# Patient Record
Sex: Female | Born: 1980 | Race: White | Hispanic: No | Marital: Single | Smoking: Current every day smoker
Health system: Southern US, Community
[De-identification: ages and names within clinical notes are randomized; demographics above are authoritative.]

## PROBLEM LIST (undated history)

## (undated) DIAGNOSIS — D219 Benign neoplasm of connective and other soft tissue, unspecified: Secondary | ICD-10-CM

## (undated) HISTORY — PX: OTHER SURGICAL HISTORY: SHX169

## (undated) HISTORY — PX: CHOLECYSTECTOMY: SHX55

## (undated) HISTORY — PX: APPENDECTOMY: SHX54

## (undated) HISTORY — PX: TONSILLECTOMY: SUR1361

---

## 2015-04-11 ENCOUNTER — Emergency Department: Payer: Self-pay

## 2015-04-11 ENCOUNTER — Emergency Department
Admission: EM | Admit: 2015-04-11 | Discharge: 2015-04-11 | Disposition: A | Discharge status: Home / Self Care | Payer: Self-pay | Attending: Emergency Medicine | Admitting: Emergency Medicine

## 2015-04-11 ENCOUNTER — Encounter: Payer: Self-pay | Admitting: Emergency Medicine

## 2015-04-11 DIAGNOSIS — Z720 Tobacco use: Secondary | ICD-10-CM | POA: Insufficient documentation

## 2015-04-11 DIAGNOSIS — R519 Headache, unspecified: Secondary | ICD-10-CM

## 2015-04-11 DIAGNOSIS — R51 Headache: Secondary | ICD-10-CM | POA: Insufficient documentation

## 2015-04-11 MED ORDER — BUTALBITAL-APAP-CAFFEINE 50-325-40 MG PO TABS: 1.0000 | ORAL_TABLET | Freq: Four times a day (QID) | ORAL | Status: AC | PRN

## 2015-04-11 NOTE — ED Notes (Signed)
Pt returned from CT, await results and dispo.

## 2015-04-11 NOTE — Discharge Instructions (Signed)

## 2015-04-11 NOTE — ED Notes (Signed)
Patient verbalized instructions given and need for follow up.

## 2015-04-11 NOTE — ED Notes (Signed)
Pt to ed with c/o headache, states she had a head injury about 8 months ago. Reports she had large laceration and was blind for a few hours.  Pt states she was not seen at that time but states since she has had headaches frequently.

## 2015-04-11 NOTE — ED Provider Notes (Signed)
[facility] Emergency Department Provider Note  ____________________________________________  Time seen: On arrival  I have reviewed the triage vital signs and the nursing notes.   HISTORY  Chief Complaint Headache    HPI Katrina Nguyen is a 34 y.o. female who presents with headache. Patient reports that she had a head injury 8 months ago and never got checked out and since then she has had frequent headaches. She decided to come to the emergency department today to be evaluated for these. She currently does not have a headache. She is anxious that she is in serious damage to her brain. No fevers no chills. No neck pain. No focal deficits. No sick contacts     History reviewed. No pertinent past medical history.  There are no active problems to display for this patient.   Past Surgical History  Procedure Laterality Date  . Tubal    . Appendectomy      Current Outpatient Rx  Name  Route  Sig  Dispense  Refill  . acetaminophen (TYLENOL) 325 MG tablet   Oral   Take 650 mg by mouth every 6 (six) hours as needed for mild pain, moderate pain, fever or headache.         . diphenhydrAMINE (BENADRYL) 25 mg capsule   Oral   Take 25 mg by mouth every 6 (six) hours as needed for itching or allergies.         Marland Kitchen ibuprofen (ADVIL,MOTRIN) 200 MG tablet   Oral   Take 400 mg by mouth every 6 (six) hours as needed for fever, headache, mild pain or moderate pain.           Allergies Review of patient's allergies indicates no known allergies.  History reviewed. No pertinent family history.  Social History Social History  Substance Use Topics  . Smoking status: Current Every Day Smoker  . Smokeless tobacco: None  . Alcohol Use: No    Review of Systems  Constitutional: Negative for fever. Eyes: Negative for visual changes. ENT: Negative for sore throat Cardiovascular: Negative for chest pain. Respiratory: Negative for shortness of  breath. Gastrointestinal: Negative for abdominal pain, vomiting and diarrhea. Genitourinary: Negative for dysuria. Musculoskeletal: Negative for back pain. Skin: Negative for rash. Neurological: Positive for headaches Psychiatric: Mild anxiety    ____________________________________________   PHYSICAL EXAM:  VITAL SIGNS: ED Triage Vitals  Enc Vitals Group     BP 04/11/15 0921 139/99 mmHg     Pulse Rate 04/11/15 0921 84     Resp 04/11/15 0921 20     Temp 04/11/15 0921 98.2 F (36.8 C)     Temp Source 04/11/15 0921 Oral     SpO2 04/11/15 0921 99 %     Weight 04/11/15 0921 152 lb (68.947 kg)     Height 04/11/15 0921 5\' 3"  (1.6 m)     Head Cir --      Peak Flow --      Pain Score 04/11/15 0916 4     Pain Loc --      Pain Edu? --      Excl. in [place]? --      Constitutional: Alert and oriented. Well appearing and in no distress. Eyes: Conjunctivae are normal.  ENT   Head: Normocephalic and atraumatic.   Mouth/Throat: Mucous membranes are moist. Cardiovascular: Normal rate, regular rhythm. Normal and symmetric distal pulses are present in all extremities. No murmurs, rubs, or gallops. Respiratory: Normal respiratory effort without tachypnea nor retractions.  Breath sounds are clear and equal bilaterally.  Gastrointestinal: Soft and non-tender in all quadrants. No distention. There is no CVA tenderness. Genitourinary: deferred Musculoskeletal: Nontender with normal range of motion in all extremities. No lower extremity tenderness nor edema. Neurologic:  Normal speech and language. No gross focal neurologic deficits are appreciated. Skin:  Skin is warm, dry and intact. No rash noted. Psychiatric: Mood and affect are normal. Patient exhibits appropriate insight and judgment.  ____________________________________________    LABS (pertinent positives/negatives)  Labs Reviewed - No data to  display  ____________________________________________   EKG  None  ____________________________________________    RADIOLOGY I have personally reviewed any xrays that were ordered on this patient: CT head is unremarkable  ____________________________________________   PROCEDURES  Procedure(s) performed: none  Critical Care performed: none  ____________________________________________   INITIAL IMPRESSION / ASSESSMENT AND PLAN / ED COURSE  Pertinent labs & imaging results that were available during my care of the patient were reviewed by me and considered in my medical decision making (see chart for details).  CT head unremarkable. Benign exam. Well appearing patient. We will treat with NSAIDs and Fioricet as needed for headaches and have her follow-up with neurology  ____________________________________________   FINAL CLINICAL IMPRESSION(S) / ED DIAGNOSES  Final diagnoses:  Acute nonintractable headache, unspecified headache type     Lavonia Drafts, MD 04/11/15 1309

## 2015-04-11 NOTE — ED Notes (Signed)
Patient transported to CT 

## 2015-10-04 ENCOUNTER — Emergency Department
Admission: EM | Admit: 2015-10-04 | Discharge: 2015-10-04 | Disposition: A | Discharge status: Home / Self Care | Payer: Self-pay | Attending: Emergency Medicine | Admitting: Emergency Medicine

## 2015-10-04 ENCOUNTER — Emergency Department: Payer: Self-pay

## 2015-10-04 ENCOUNTER — Encounter: Payer: Self-pay | Admitting: Emergency Medicine

## 2015-10-04 DIAGNOSIS — J9801 Acute bronchospasm: Secondary | ICD-10-CM | POA: Insufficient documentation

## 2015-10-04 DIAGNOSIS — F17200 Nicotine dependence, unspecified, uncomplicated: Secondary | ICD-10-CM | POA: Insufficient documentation

## 2015-10-04 DIAGNOSIS — F419 Anxiety disorder, unspecified: Secondary | ICD-10-CM | POA: Insufficient documentation

## 2015-10-04 LAB — COMPREHENSIVE METABOLIC PANEL
ALBUMIN: 3.9 g/dL (ref 3.5–5.0)
ALT: 13 U/L — ABNORMAL LOW (ref 14–54)
AST: 14 U/L — AB (ref 15–41)
Alkaline Phosphatase: 64 U/L (ref 38–126)
Anion gap: 8 (ref 5–15)
BUN: 18 mg/dL (ref 6–20)
CHLORIDE: 109 mmol/L (ref 101–111)
CO2: 21 mmol/L — ABNORMAL LOW (ref 22–32)
Calcium: 8.8 mg/dL — ABNORMAL LOW (ref 8.9–10.3)
Creatinine, Ser: 1.25 mg/dL — ABNORMAL HIGH (ref 0.44–1.00)
GFR calc Af Amer: >60 mL/min (ref >60)
GFR calc non Af Amer: 55 mL/min — ABNORMAL LOW (ref >60)
Glucose, Bld: 85 mg/dL (ref 65–99)
POTASSIUM: 4.4 mmol/L (ref 3.5–5.1)
Sodium: 138 mmol/L (ref 135–145)
Total Bilirubin: 0.3 mg/dL (ref 0.3–1.2)
Total Protein: 7.5 g/dL (ref 6.5–8.1)

## 2015-10-04 LAB — CBC
HCT: 42.5 % (ref 35.0–47.0)
Hemoglobin: 14.2 g/dL (ref 12.0–16.0)
MCH: 27.7 pg (ref 26.0–34.0)
MCHC: 33.3 g/dL (ref 32.0–36.0)
MCV: 83.2 fL (ref 80.0–100.0)
Platelets: 320 10*3/uL (ref 150–440)
RBC: 5.11 MIL/uL (ref 3.80–5.20)
RDW: 15.2 % — ABNORMAL HIGH (ref 11.5–14.5)
WBC: 12.1 10*3/uL — ABNORMAL HIGH (ref 3.6–11.0)

## 2015-10-04 LAB — TROPONIN I

## 2015-10-04 MED ORDER — IPRATROPIUM-ALBUTEROL 0.5-2.5 (3) MG/3ML IN SOLN
RESPIRATORY_TRACT | Status: AC
  Administered 2015-10-04: 3 mL via RESPIRATORY_TRACT
  Filled 2015-10-04: qty 3

## 2015-10-04 MED ORDER — PREDNISONE 50 MG PO TABS: 50.0000 mg | ORAL_TABLET | Freq: Every day | ORAL | Status: AC

## 2015-10-04 MED ORDER — IPRATROPIUM-ALBUTEROL 0.5-2.5 (3) MG/3ML IN SOLN
3.0000 mL | Freq: Once | RESPIRATORY_TRACT | Status: AC
  Administered 2015-10-04: 3 mL via RESPIRATORY_TRACT

## 2015-10-04 MED ORDER — ALBUTEROL SULFATE HFA 108 (90 BASE) MCG/ACT IN AERS: 2.0000 | INHALATION_SPRAY | Freq: Four times a day (QID) | RESPIRATORY_TRACT | Status: AC | PRN

## 2015-10-04 MED ORDER — IPRATROPIUM-ALBUTEROL 0.5-2.5 (3) MG/3ML IN SOLN
3.0000 mL | Freq: Once | RESPIRATORY_TRACT | Status: AC
  Administered 2015-10-04: 3 mL via RESPIRATORY_TRACT
  Filled 2015-10-04: qty 3

## 2015-10-04 MED ORDER — ALBUTEROL SULFATE (2.5 MG/3ML) 0.083% IN NEBU
2.5000 mg | INHALATION_SOLUTION | Freq: Once | RESPIRATORY_TRACT | Status: AC
  Administered 2015-10-04: 2.5 mg via RESPIRATORY_TRACT
  Filled 2015-10-04: qty 3

## 2015-10-04 MED ORDER — PREDNISONE 20 MG PO TABS
60.0000 mg | ORAL_TABLET | Freq: Once | ORAL | Status: AC
  Administered 2015-10-04: 60 mg via ORAL
  Filled 2015-10-04: qty 3

## 2015-10-04 NOTE — Discharge Instructions (Signed)
Bronchospasm, Adult  A bronchospasm is a spasm or tightening of the airways going into the lungs. During a bronchospasm breathing becomes more difficult because the airways get smaller. When this happens there can be coughing, a whistling sound when breathing (wheezing), and difficulty breathing. Bronchospasm is often associated with asthma, but not all patients who experience a bronchospasm have asthma.  CAUSES   A bronchospasm is caused by inflammation or irritation of the airways. The inflammation or irritation may be triggered by:   · Allergies (such as to animals, pollen, food, or mold). Allergens that cause bronchospasm may cause wheezing immediately after exposure or many hours later.    · Infection. Viral infections are believed to be the most common cause of bronchospasm.    · Exercise.    · Irritants (such as pollution, cigarette smoke, strong odors, aerosol sprays, and paint fumes).    · Weather changes. Winds increase molds and pollens in the air. Rain refreshes the air by washing irritants out. Cold air may cause inflammation.    · Stress and emotional upset.    SIGNS AND SYMPTOMS   · Wheezing.    · Excessive nighttime coughing.    · Frequent or severe coughing with a simple cold.    · Chest tightness.    · Shortness of breath.    DIAGNOSIS   Bronchospasm is usually diagnosed through a history and physical exam. Tests, such as chest X-rays, are sometimes done to look for other conditions.  TREATMENT   · Inhaled medicines can be given to open up your airways and help you breathe. The medicines can be given using either an inhaler or a nebulizer machine.  · Corticosteroid medicines may be given for severe bronchospasm, usually when it is associated with asthma.  HOME CARE INSTRUCTIONS   · Always have a plan prepared for seeking medical care. Know when to call your health care provider and local emergency services (911 in the [place] Know where you can access local emergency care.  · Only take medicines as  directed by your health care provider.  · If you were prescribed an inhaler or nebulizer machine, ask your health care provider to explain how to use it correctly. Always use a spacer with your inhaler if you were given one.  · It is necessary to remain calm during an attack. Try to relax and breathe more slowly.   · Control your home environment in the following ways:      Change your heating and air conditioning filter at least once a month.      Limit your use of fireplaces and wood stoves.    Do not smoke and do not allow smoking in your home.      Avoid exposure to perfumes and fragrances.      Get rid of pests (such as roaches and mice) and their droppings.      Throw away plants if you see mold on them.      Keep your house clean and dust free.      Replace carpet with wood, tile, or vinyl flooring. Carpet can trap dander and dust.      Use allergy-proof pillows, mattress covers, and box spring covers.      Wash bed sheets and blankets every week in hot water and dry them in a dryer.      Use blankets that are made of polyester or cotton.      Wash hands frequently.  SEEK MEDICAL CARE IF:   · You have muscle aches.    · You have chest pain.    · The sputum changes from clear or   white to yellow, green, gray, or bloody.    · The sputum you cough up gets thicker.    · There are problems that may be related to the medicine you are given, such as a rash, itching, swelling, or trouble breathing.    SEEK IMMEDIATE MEDICAL CARE IF:   · You have worsening wheezing and coughing even after taking your prescribed medicines.    · You have increased difficulty breathing.    · You develop severe chest pain.  MAKE SURE YOU:   · Understand these instructions.  · Will watch your condition.  · Will get help right away if you are not doing well or get worse.     This information is not intended to replace advice given to you by your health care provider. Make sure you discuss any questions you have with your health care  provider.     Document Released: 08/19/2003 Document Revised: 09/06/2014 Document Reviewed: 02/05/2013  Elsevier Interactive Patient Education ©2016 [employer]

## 2015-10-04 NOTE — ED Notes (Signed)
Patient c/o shortness of breath. Patient states that yesterday she had a spell where she was extremely short of breath, it hurts more to breath out than in, patient gets short of breath when walking. Patient is also c/o some chest pain in her central chest that radiates through to the back.

## 2015-10-04 NOTE — ED Provider Notes (Signed)
[facility] Emergency Department Provider Note  ____________________________________________    I have reviewed the triage vital signs and the nursing notes.   HISTORY  Chief Complaint Shortness of Breath    HPI Katrina Nguyen is a 35 y.o. female who presents with complaints of shortness of breath. She reports over the last several months she has noted increasing shortness of breath especially with exertion. She denies chest pain. She does report that her lungs feel "tight. She does smoke and has done so for quite some time. Her mother died of emphysema. She denies fevers chills. Occasional cough. No recent travel. No leg swelling. No calf pain.     History reviewed. No pertinent past medical history.  There are no active problems to display for this patient.   Past Surgical History  Procedure Laterality Date  . Tubal    . Appendectomy    . Cholecystectomy    . Tonsillectomy      Current Outpatient Rx  Name  Route  Sig  Dispense  Refill  . albuterol (PROVENTIL HFA;VENTOLIN HFA) 108 (90 Base) MCG/ACT inhaler   Inhalation   Inhale 2 puffs into the lungs every 6 (six) hours as needed for wheezing or shortness of breath.   1 Inhaler   2   . butalbital-acetaminophen-caffeine (FIORICET) 50-325-40 MG per tablet   Oral   Take 1-2 tablets by mouth every 6 (six) hours as needed for headache.   20 tablet   0   . predniSONE (DELTASONE) 50 MG tablet   Oral   Take 1 tablet (50 mg total) by mouth daily with breakfast.   5 tablet   0     Allergies Review of patient's allergies indicates no known allergies.  No family history on file.  Social History Social History  Substance Use Topics  . Smoking status: Current Every Day Smoker  . Smokeless tobacco: None  . Alcohol Use: No    Review of Systems  Constitutional: Negative for fever. Eyes: Negative for visual changes. ENT: Negative for sore throat Cardiovascular: Negative for chest  pain. Respiratory: As above, negative for cough Gastrointestinal: Negative for abdominal pain Genitourinary: Negative for dysuria. Musculoskeletal: Negative for back pain. Skin: Negative for rash. Neurological: Negative for headaches  Psychiatric: Positive for anxiety    ____________________________________________   PHYSICAL EXAM:  VITAL SIGNS: ED Triage Vitals  Enc Vitals Group     BP 10/04/15 0841 124/85 mmHg     Pulse Rate 10/04/15 0841 86     Resp 10/04/15 0841 22     Temp 10/04/15 0841 97.5 F (36.4 C)     Temp Source 10/04/15 0841 Oral     SpO2 10/04/15 0841 95 %     Weight 10/04/15 0841 150 lb (68.04 kg)     Height 10/04/15 0841 5\' 1"  (1.549 m)     Head Cir --      Peak Flow --      Pain Score 10/04/15 0842 5     Pain Loc --      Pain Edu? --      Excl. in [place]? --      Constitutional: Alert and oriented. Well appearing and in no distress. Eyes: Conjunctivae are normal.  ENT   Head: Normocephalic and atraumatic.   Mouth/Throat: Mucous membranes are moist. Cardiovascular: Normal rate, regular rhythm. Normal and symmetric distal pulses are present in all extremities. No murmurs, rubs, or gallops. Respiratory: Normal respiratory effort without tachypnea nor retractions. Poor air  movement on exam Gastrointestinal: Soft and non-tender in all quadrants. No distention. There is no CVA tenderness. Genitourinary: deferred Musculoskeletal: Nontender with normal range of motion in all extremities. No lower extremity tenderness nor edema. Neurologic:  Normal speech and language. No gross focal neurologic deficits are appreciated. Skin:  Skin is warm, dry and intact. No rash noted. Psychiatric: Mood and affect are normal. Patient exhibits appropriate insight and judgment.  ____________________________________________    LABS (pertinent positives/negatives)  Labs Reviewed  CBC - Abnormal; Notable for the following:    WBC 12.1 (*)    RDW 15.2 (*)    All other  components within normal limits  COMPREHENSIVE METABOLIC PANEL - Abnormal; Notable for the following:    CO2 21 (*)    Creatinine, Ser 1.25 (*)    Calcium 8.8 (*)    AST 14 (*)    ALT 13 (*)    GFR calc non Af Amer 55 (*)    All other components within normal limits  TROPONIN I    ____________________________________________   EKG  ED ECG REPORT I, Lavonia Drafts, the attending physician, personally viewed and interpreted this ECG.  Date: 10/04/2015 EKG Time: 9:26 AM Rate: 72 Rhythm: normal sinus rhythm QRS Axis: normal Intervals: normal ST/T Wave abnormalities: normal Conduction Disturbances: none Narrative Interpretation: unremarkable   ____________________________________________    RADIOLOGY I have personally reviewed any xrays that were ordered on this patient: Chest x-ray normal  ____________________________________________   PROCEDURES  Procedure(s) performed: none  Critical Care performed: none  ____________________________________________   INITIAL IMPRESSION / ASSESSMENT AND PLAN / ED COURSE  Pertinent labs & imaging results that were available during my care of the patient were reviewed by me and considered in my medical decision making (see chart for details).  Patient's presentation is most consistent with bronchospasm/COPD. We will treat with DuoNeb, steroids, check labs, EKG and chest x-ray and reevaluate.  PERC negative, ekg/trop negative  Improving with nebulizers and prednisone.   Workup is unremarkable. History of present illness an exam most consistent with bronchospasm likely early COPD given the patient's severe smoking history. I did spend a significant amount of time counseling her to quit smoking. I'll have her follow-up with medicine for further workup  ____________________________________________   FINAL CLINICAL IMPRESSION(S) / ED DIAGNOSES  Final diagnoses:  Bronchospasm, acute     Lavonia Drafts, MD 10/04/15 1126

## 2015-10-04 NOTE — ED Notes (Signed)
Patient reports feeling short of breath for several months.  Patient states she was exposed to mold during this summer and has had some breathing difficulties since then.  Patient reports shortness of breath has gotten worse over the past two weeks.  Patient states yesterday she couldn't catch her breath for several hours and patient ended up having a panic attack.  Patient reports family history of lung disease.  Patient reports feeling short of breath when she takes short walks.  Patient is speaking in full sentences without any difficulty at this time.

## 2016-01-10 ENCOUNTER — Emergency Department
Admission: EM | Admit: 2016-01-10 | Discharge: 2016-01-10 | Disposition: A | Discharge status: Home / Self Care | Payer: Self-pay | Attending: Emergency Medicine | Admitting: Emergency Medicine

## 2016-01-10 ENCOUNTER — Encounter: Payer: Self-pay | Admitting: Emergency Medicine

## 2016-01-10 DIAGNOSIS — F17210 Nicotine dependence, cigarettes, uncomplicated: Secondary | ICD-10-CM | POA: Insufficient documentation

## 2016-01-10 DIAGNOSIS — H66001 Acute suppurative otitis media without spontaneous rupture of ear drum, right ear: Secondary | ICD-10-CM | POA: Insufficient documentation

## 2016-01-10 DIAGNOSIS — J0140 Acute pansinusitis, unspecified: Secondary | ICD-10-CM | POA: Insufficient documentation

## 2016-01-10 DIAGNOSIS — Z79899 Other long term (current) drug therapy: Secondary | ICD-10-CM | POA: Insufficient documentation

## 2016-01-10 MED ORDER — FLUTICASONE FUROATE 27.5 MCG/SPRAY NA SUSP: 1.0000 | Freq: Every day | NASAL | Status: AC

## 2016-01-10 MED ORDER — CEFTRIAXONE SODIUM 1 G IJ SOLR
1.0000 g | Freq: Once | INTRAMUSCULAR | Status: AC
  Administered 2016-01-10: 1 g via INTRAMUSCULAR
  Filled 2016-01-10: qty 10

## 2016-01-10 MED ORDER — HYDROCOD POLST-CPM POLST ER 10-8 MG/5ML PO SUER
5.0000 mL | Freq: Once | ORAL | Status: AC
Start: 2016-01-10 — End: 2016-01-10
  Administered 2016-01-10: 5 mL via ORAL
  Filled 2016-01-10: qty 5

## 2016-01-10 MED ORDER — GUAIFENESIN-CODEINE 100-10 MG/5ML PO SYRP
5.0000 mL | ORAL_SOLUTION | Freq: Three times a day (TID) | ORAL | Status: AC | PRN
Start: 2016-01-10 — End: ?

## 2016-01-10 MED ORDER — AMOXICILLIN 500 MG PO TABS: 500.0000 mg | ORAL_TABLET | Freq: Three times a day (TID) | ORAL | Status: AC

## 2016-01-10 MED ORDER — LIDOCAINE HCL (PF) 1 % IJ SOLN
2.1000 mL | Freq: Once | INTRAMUSCULAR | Status: AC
  Administered 2016-01-10: 2.1 mL
  Filled 2016-01-10: qty 5

## 2016-01-10 NOTE — Discharge Instructions (Signed)

## 2016-01-10 NOTE — ED Notes (Signed)
Pt c/o sinus congestion for a week, green/white/yellow sinus drainage; blew her nose earlier and her right ear popped; now having ear pain; pt ambulatory with steady gait

## 2016-01-10 NOTE — ED Provider Notes (Signed)
[facility] Emergency Department Provider Note  ____________________________________________  Time seen: Approximately 9:15 PM  I have reviewed the triage vital signs and the nursing notes.   HISTORY  Chief Complaint Otalgia and Nasal Congestion   HPI Icelynn Toki is a 35 y.o. female who presents to the emergency department for evaluation of right ear pain, sore throat, and sinus congestion. She's had no relief with over-the-counter medications. Symptoms have been present for the past week or more.   History reviewed. No pertinent past medical history.  There are no active problems to display for this patient.   Past Surgical History  Procedure Laterality Date  . Tubal    . Appendectomy    . Cholecystectomy    . Tonsillectomy      Current Outpatient Rx  Name  Route  Sig  Dispense  Refill  . albuterol (PROVENTIL HFA;VENTOLIN HFA) 108 (90 Base) MCG/ACT inhaler   Inhalation   Inhale 2 puffs into the lungs every 6 (six) hours as needed for wheezing or shortness of breath.   1 Inhaler   2   . amoxicillin (AMOXIL) 500 MG tablet   Oral   Take 1 tablet (500 mg total) by mouth 3 (three) times daily.   30 tablet   0   . butalbital-acetaminophen-caffeine (FIORICET) 50-325-40 MG per tablet   Oral   Take 1-2 tablets by mouth every 6 (six) hours as needed for headache.   20 tablet   0   . fluticasone (VERAMYST) 27.5 MCG/SPRAY nasal spray   Nasal   Place 1 spray into the nose daily.   10 g   0   . guaiFENesin-codeine (ROBITUSSIN AC) 100-10 MG/5ML syrup   Oral   Take 5 mLs by mouth 3 (three) times daily as needed for cough.   120 mL   0   . predniSONE (DELTASONE) 50 MG tablet   Oral   Take 1 tablet (50 mg total) by mouth daily with breakfast.   5 tablet   0     Allergies Review of patient's allergies indicates no known allergies.  History reviewed. No pertinent family history.  Social History Social History  Substance Use Topics   . Smoking status: Current Every Day Smoker    Types: Cigarettes  . Smokeless tobacco: None  . Alcohol Use: No    Review of Systems Constitutional: Positive fever/chills ENT: Positive for sore throat. Cardiovascular: Denies chest pain. Respiratory: Negative for shortness of breath. Positive for cough. Gastrointestinal: Negative for nausea,  negative for vomiting.  Negative for diarrhea.  Musculoskeletal: Negative for body aches Skin: Negative for rash. Neurological: Positive for headaches ____________________________________________   PHYSICAL EXAM:  VITAL SIGNS: ED Triage Vitals  Enc Vitals Group     BP 01/10/16 2000 125/79 mmHg     Pulse Rate 01/10/16 2000 90     Resp 01/10/16 2000 18     Temp 01/10/16 2000 98.4 F (36.9 C)     Temp Source 01/10/16 2000 Oral     SpO2 01/10/16 2000 99 %     Weight 01/10/16 2000 155 lb (70.308 kg)     Height 01/10/16 2000 5\' 3"  (1.6 m)     Head Cir --      Peak Flow --      Pain Score 01/10/16 2002 5     Pain Loc --      Pain Edu? --      Excl. in [place]? --     Constitutional:  Alert and oriented. Acutely ill appearing and in no acute distress. Eyes: Conjunctivae are normal. EOMI. Ears: Right tympanic membrane erythematous, dull, loss of light reflex, intact; left tympanic membrane normal Nose: Sinus congestion and tenderness throughout but mainly on the right maxillary; no rhinnorhea. Mouth/Throat: Mucous membranes are moist.  Oropharynx mildly erythematous. Tonsils appear mildly enlarged without exudate. Neck: No stridor.  Lymphatic: No cervical lymphadenopathy. Cardiovascular: Normal rate, regular rhythm. Grossly normal heart sounds.  Good peripheral circulation. Respiratory: Normal respiratory effort.  No retractions. Diminished but clear throughout. Gastrointestinal: Soft and nontender.  Musculoskeletal: FROM x 4 extremities.  Neurologic:  Normal speech and language.  Skin:  Skin is warm, dry and intact. No rash  noted. Psychiatric: Mood and affect are normal. Speech and behavior are normal.  ____________________________________________   LABS (all labs ordered are listed, but only abnormal results are displayed)  Labs Reviewed - No data to display ____________________________________________  EKG   ____________________________________________  RADIOLOGY   ____________________________________________   PROCEDURES  Procedure(s) performed: None  Critical Care performed: No  ____________________________________________   INITIAL IMPRESSION / ASSESSMENT AND PLAN / ED COURSE  Pertinent labs & imaging results that were available during my care of the patient were reviewed by me and considered in my medical decision making (see chart for details).   Patient was encouraged to follow up with the primary care provider of her choice for symptoms that are not improving over the next 48 hours. She'll be placed on amoxicillin, fluticasone nasal spray, and Robitussin-AC. She was given Rocephin 1 g IM while in the emergency department. She was advised to return to the emergency department for symptoms that change or worsen if some unable schedule an appointment. ____________________________________________   FINAL CLINICAL IMPRESSION(S) / ED DIAGNOSES  Final diagnoses:  Acute suppurative otitis media of right ear without spontaneous rupture of tympanic membrane, recurrence not specified  Acute pansinusitis, recurrence not specified       Victorino Dike, FNP 01/10/16 2225  Lavonia Drafts, MD 01/10/16 2243

## 2016-08-01 ENCOUNTER — Encounter: Payer: Self-pay | Admitting: Emergency Medicine

## 2016-08-01 ENCOUNTER — Emergency Department: Payer: Self-pay

## 2016-08-01 ENCOUNTER — Emergency Department
Admission: EM | Admit: 2016-08-01 | Discharge: 2016-08-01 | Disposition: A | Discharge status: Home / Self Care | Payer: Self-pay | Attending: Emergency Medicine | Admitting: Emergency Medicine

## 2016-08-01 DIAGNOSIS — N941 Unspecified dyspareunia: Secondary | ICD-10-CM

## 2016-08-01 DIAGNOSIS — R102 Pelvic and perineal pain unspecified side: Secondary | ICD-10-CM

## 2016-08-01 DIAGNOSIS — R1031 Right lower quadrant pain: Secondary | ICD-10-CM

## 2016-08-01 DIAGNOSIS — Z79899 Other long term (current) drug therapy: Secondary | ICD-10-CM | POA: Insufficient documentation

## 2016-08-01 DIAGNOSIS — A599 Trichomoniasis, unspecified: Secondary | ICD-10-CM

## 2016-08-01 DIAGNOSIS — N83511 Torsion of right ovary and ovarian pedicle: Secondary | ICD-10-CM | POA: Insufficient documentation

## 2016-08-01 DIAGNOSIS — F17210 Nicotine dependence, cigarettes, uncomplicated: Secondary | ICD-10-CM | POA: Insufficient documentation

## 2016-08-01 DIAGNOSIS — N83519 Torsion of ovary and ovarian pedicle, unspecified side: Secondary | ICD-10-CM

## 2016-08-01 LAB — WET PREP, GENITAL
CLUE CELLS WET PREP: NONE SEEN
SPERM: NONE SEEN
YEAST WET PREP: NONE SEEN

## 2016-08-01 LAB — COMPREHENSIVE METABOLIC PANEL
ALK PHOS: 88 U/L (ref 38–126)
ALT: 12 U/L — AB (ref 14–54)
AST: 22 U/L (ref 15–41)
Albumin: 4.2 g/dL (ref 3.5–5.0)
Anion gap: 8 (ref 5–15)
BILIRUBIN TOTAL: 0.9 mg/dL (ref 0.3–1.2)
BUN: 16 mg/dL (ref 6–20)
CO2: 20 mmol/L — ABNORMAL LOW (ref 22–32)
CREATININE: 0.93 mg/dL (ref 0.44–1.00)
Calcium: 9.5 mg/dL (ref 8.9–10.3)
Chloride: 104 mmol/L (ref 101–111)
Glucose, Bld: 131 mg/dL — ABNORMAL HIGH (ref 65–99)
Potassium: 4.4 mmol/L (ref 3.5–5.1)
SODIUM: 132 mmol/L — AB (ref 135–145)
Total Protein: 8 g/dL (ref 6.5–8.1)

## 2016-08-01 LAB — CBC
HCT: 40.6 % (ref 35.0–47.0)
Hemoglobin: 13.8 g/dL (ref 12.0–16.0)
MCH: 27.4 pg (ref 26.0–34.0)
MCHC: 33.9 g/dL (ref 32.0–36.0)
MCV: 80.8 fL (ref 80.0–100.0)
PLATELETS: 372 10*3/uL (ref 150–440)
RBC: 5.03 MIL/uL (ref 3.80–5.20)
RDW: 18.3 % — ABNORMAL HIGH (ref 11.5–14.5)
WBC: 14.4 10*3/uL — ABNORMAL HIGH (ref 3.6–11.0)

## 2016-08-01 LAB — URINALYSIS COMPLETE WITH MICROSCOPIC (ARMC ONLY)
Bilirubin Urine: NEGATIVE
Glucose, UA: NEGATIVE mg/dL
Nitrite: NEGATIVE
Protein, ur: 30 mg/dL — AB
Specific Gravity, Urine: 1.025 (ref 1.005–1.030)
pH: 5 (ref 5.0–8.0)

## 2016-08-01 LAB — CHLAMYDIA/NGC RT PCR (ARMC ONLY)
Chlamydia Tr: NOT DETECTED
N gonorrhoeae: NOT DETECTED

## 2016-08-01 LAB — PREGNANCY, URINE: Preg Test, Ur: NEGATIVE

## 2016-08-01 MED ORDER — ONDANSETRON 4 MG PO TBDP: ORAL_TABLET | ORAL | 0 refills | Status: AC

## 2016-08-01 MED ORDER — IOPAMIDOL (ISOVUE-300) INJECTION 61%
100.0000 mL | Freq: Once | INTRAVENOUS | Status: AC | PRN
  Administered 2016-08-01: 100 mL via INTRAVENOUS
  Filled 2016-08-01: qty 100

## 2016-08-01 MED ORDER — AZITHROMYCIN 250 MG PO TABS
1000.0000 mg | ORAL_TABLET | ORAL | Status: AC
  Administered 2016-08-01: 1000 mg via ORAL

## 2016-08-01 MED ORDER — AZITHROMYCIN 500 MG PO TABS
ORAL_TABLET | ORAL | Status: AC
  Administered 2016-08-01: 1000 mg via ORAL
  Filled 2016-08-01: qty 2

## 2016-08-01 MED ORDER — METRONIDAZOLE 500 MG PO TABS
500.0000 mg | ORAL_TABLET | Freq: Once | ORAL | Status: AC
  Administered 2016-08-01: 500 mg via ORAL
  Filled 2016-08-01: qty 1

## 2016-08-01 MED ORDER — METRONIDAZOLE 500 MG PO TABS: 500.0000 mg | ORAL_TABLET | Freq: Two times a day (BID) | ORAL | 0 refills | Status: AC

## 2016-08-01 MED ORDER — CEFTRIAXONE SODIUM 250 MG IJ SOLR
INTRAMUSCULAR | Status: AC
  Administered 2016-08-01: 250 mg via INTRAMUSCULAR
  Filled 2016-08-01: qty 250

## 2016-08-01 MED ORDER — IOPAMIDOL (ISOVUE-300) INJECTION 61%
30.0000 mL | Freq: Once | INTRAVENOUS | Status: AC | PRN
  Administered 2016-08-01: 30 mL via ORAL
  Filled 2016-08-01: qty 30

## 2016-08-01 MED ORDER — OXYCODONE-ACETAMINOPHEN 5-325 MG PO TABS
ORAL_TABLET | ORAL | Status: AC
  Administered 2016-08-01: 2 via ORAL
  Filled 2016-08-01: qty 2

## 2016-08-01 MED ORDER — CEFTRIAXONE SODIUM 250 MG IJ SOLR
250.0000 mg | Freq: Once | INTRAMUSCULAR | Status: AC
  Administered 2016-08-01: 250 mg via INTRAMUSCULAR

## 2016-08-01 MED ORDER — OXYCODONE-ACETAMINOPHEN 5-325 MG PO TABS
2.0000 | ORAL_TABLET | Freq: Once | ORAL | Status: AC
  Administered 2016-08-01: 2 via ORAL

## 2016-08-01 NOTE — ED Notes (Signed)
Called lab and asked to run urine sample for pregnancy so ultrasound can be performed. Russell in lab states that he will perform test now.

## 2016-08-01 NOTE — ED Notes (Signed)
CT informed that patient is finished with oral contrast

## 2016-08-01 NOTE — ED Notes (Signed)
Right lower quadrant pain X 3 weeks, described as aching. Denies NVD. Normal BM.  Pt has not been seen for this pain before. PT ambulatory back to room. Pt alert and oriented X4, active, cooperative, pt in NAD. RR even and unlabored, color WNL.

## 2016-08-01 NOTE — ED Provider Notes (Signed)
[facility] Emergency Department Provider Note  ____________________________________________   First MD Initiated Contact with Patient 08/01/16 1436     (approximate)  I have reviewed the triage vital signs and the nursing notes.   HISTORY  Chief Complaint Pelvic Pain; Abdominal Pain; and Vaginal Itching    HPI Katrina Nguyen is a 35 y.o. female who denies any chronic medical problems but who has had a bilateral tubal ligation, appendectomy, and cholecystectomy and presents for gradual onset about worsening right lower pelvic pain 3 weeks.  She states that it started mild and she was hoping it would go away but it has become severe, particularly over the last week.  She also notes severe dyspareunia and a much heavier than normal period that is currently active.  She has had occasional nausea but no vomiting.  She describes the pain as a combination of a severe aching and occasionally a sharp stabbing pain.  She states that movement  And ambulation as well as sexual intercourse I will make it worse.  She is also noticed some vaginal itching recently that she does not know if it is related.  She denies diarrhea, constipation, chest pain, shortness of breath, fever/chills, headache.  She does not have any outpatient providers, either primary care nor GYN, and has not had a Pap smear in several years.  She has no history of kidney stones.  History reviewed. No pertinent past medical history.  There are no active problems to display for this patient.   Past Surgical History:  Procedure Laterality Date  . APPENDECTOMY    . CHOLECYSTECTOMY    . TONSILLECTOMY    . tubal      Prior to Admission medications   Medication Sig Start Date End Date Taking? Authorizing Provider  albuterol (PROVENTIL HFA;VENTOLIN HFA) 108 (90 Base) MCG/ACT inhaler Inhale 2 puffs into the lungs every 6 (six) hours as needed for wheezing or shortness of breath. 10/04/15   Lavonia Drafts, MD   amoxicillin (AMOXIL) 500 MG tablet Take 1 tablet (500 mg total) by mouth 3 (three) times daily. 01/10/16   Cari B Triplett, FNP  fluticasone (VERAMYST) 27.5 MCG/SPRAY nasal spray Place 1 spray into the nose daily. 01/10/16   Cari B Triplett, FNP  guaiFENesin-codeine (ROBITUSSIN AC) 100-10 MG/5ML syrup Take 5 mLs by mouth 3 (three) times daily as needed for cough. 01/10/16   Victorino Dike, FNP  metroNIDAZOLE (FLAGYL) 500 MG tablet Take 1 tablet (500 mg total) by mouth 2 (two) times daily. 08/01/16   Hinda Kehr, MD  ondansetron (ZOFRAN ODT) 4 MG disintegrating tablet Allow 1-2 tablets to dissolve in your mouth every 8 hours as needed for nausea/vomiting 08/01/16   Hinda Kehr, MD  predniSONE (DELTASONE) 50 MG tablet Take 1 tablet (50 mg total) by mouth daily with breakfast. 10/04/15   Lavonia Drafts, MD    Allergies Patient has no known allergies.  History reviewed. No pertinent family history.  Social History Social History  Substance Use Topics  . Smoking status: Current Every Day Smoker    Types: Cigarettes  . Smokeless tobacco: Never Used  . Alcohol use No    Review of Systems Constitutional: No fever/chills Eyes: No visual changes. ENT: No sore throat. Cardiovascular: Denies chest pain. Respiratory: Denies shortness of breath. Gastrointestinal: No abdominal pain.  No nausea, no vomiting.  No diarrhea.  No constipation. Genitourinary: Negative for dysuria. Musculoskeletal: Negative for back pain. Skin: Negative for rash. Neurological: Negative for headaches, focal weakness  or numbness.  10-point ROS otherwise negative.  ____________________________________________   PHYSICAL EXAM:  VITAL SIGNS: ED Triage Vitals  Enc Vitals Group     BP 08/01/16 1334 123/83     Pulse Rate 08/01/16 1334 94     Resp 08/01/16 1334 19     Temp 08/01/16 1334 98.4 F (36.9 C)     Temp Source 08/01/16 1334 Oral     SpO2 08/01/16 1334 98 %     Weight 08/01/16 1334 140 lb (63.5 kg)      Height 08/01/16 1334 5\' 3"  (1.6 m)     Head Circumference --      Peak Flow --      Pain Score 08/01/16 1337 5     Pain Loc --      Pain Edu? --      Excl. in [place]? --     Constitutional: Alert and oriented. Well appearing and in no acute Distress although she does appear uncomfortable Eyes: Conjunctivae are normal. PERRL. EOMI. Head: Atraumatic. Nose: No congestion/rhinnorhea. Mouth/Throat: Mucous membranes are moist.  Oropharynx non-erythematous. Neck: No stridor.  No meningeal signs.   Cardiovascular: Normal rate, regular rhythm. Good peripheral circulation. Grossly normal heart sounds. Respiratory: Normal respiratory effort.  No retractions. Lungs CTAB. Gastrointestinal: Soft with mild tenderness to palpation of the pelvis, just right and superior of the mons pubis.  She has no upper abdominal tenderness and no periumbilical tenderness.  No distention. Genitourinary: External exam.  The patient is having her period so there is a moderate amount of blood in her vaginal vault.  Swabs obtained.  Tenderness with placement of the speculum.  Nurse present throughout as a chaperone Musculoskeletal: No lower extremity tenderness nor edema. No gross deformities of extremities. Neurologic:  Normal speech and language. No gross focal neurologic deficits are appreciated.  Skin:  Skin is warm, dry and intact. No rash noted. Psychiatric: Mood and affect are normal. Speech and behavior are normal.  ____________________________________________   LABS (all labs ordered are listed, but only abnormal results are displayed)  Labs Reviewed  WET PREP, GENITAL - Abnormal; Notable for the following:       Result Value   Trich, Wet Prep PRESENT (*)    WBC, Wet Prep HPF POC MODERATE (*)    All other components within normal limits  COMPREHENSIVE METABOLIC PANEL - Abnormal; Notable for the following:    Sodium 132 (*)    CO2 20 (*)    Glucose, Bld 131 (*)    ALT 12 (*)    All other components within  normal limits  CBC - Abnormal; Notable for the following:    WBC 14.4 (*)    RDW 18.3 (*)    All other components within normal limits  URINALYSIS COMPLETEWITH MICROSCOPIC ([facility] ONLY) - Abnormal; Notable for the following:    Color, Urine YELLOW (*)    APPearance HAZY (*)    Ketones, ur TRACE (*)    Hgb urine dipstick 3+ (*)    Protein, ur 30 (*)    Leukocytes, UA TRACE (*)    Bacteria, UA RARE (*)    Squamous Epithelial / LPF 0-5 (*)    All other components within normal limits  CHLAMYDIA/NGC RT PCR ([facility] ONLY)  PREGNANCY, URINE   ____________________________________________  EKG  None - EKG not ordered by ED physician ____________________________________________  RADIOLOGY   US Transvaginal Non-ob  Result Date: 08/01/2016 CLINICAL DATA:  Right pelvic pain for the past 3 weeks. Clinical  concern for ovarian torsion. EXAM: TRANSABDOMINAL AND TRANSVAGINAL ULTRASOUND OF PELVIS DOPPLER ULTRASOUND OF OVARIES TECHNIQUE: Both transabdominal and transvaginal ultrasound examinations of the pelvis were performed. Transabdominal technique was performed for global imaging of the pelvis including uterus, ovaries, adnexal regions, and pelvic cul-de-sac. It was necessary to proceed with endovaginal exam following the transabdominal exam to visualize the endometrium, uterus and ovaries in better detail. Color and duplex Doppler ultrasound was utilized to evaluate blood flow to the ovaries. COMPARISON:  None. FINDINGS: Uterus Measurements: 6.8 x 4.6 x 3.7 cm. No fibroids or other mass visualized. Endometrium Thickness: 4.1 mm.  No focal abnormality visualized. Right ovary Measurements: 3.3 x 2.5 x 2.0 cm. Normal appearance/no adnexal mass. Left ovary Measurements: 2.9 x 2.2 x 1.6 cm. Normal appearance/no adnexal mass. Pulsed Doppler evaluation of both ovaries demonstrates normal low-resistance arterial and venous waveforms. Other findings No abnormal free fluid. IMPRESSION: Normal  examination. Electronically Signed   By: Claudie Revering M.D.   On: 08/01/2016 15:58   US Pelvis Complete  Result Date: 08/01/2016 CLINICAL DATA:  Right pelvic pain for the past 3 weeks. Clinical concern for ovarian torsion. EXAM: TRANSABDOMINAL AND TRANSVAGINAL ULTRASOUND OF PELVIS DOPPLER ULTRASOUND OF OVARIES TECHNIQUE: Both transabdominal and transvaginal ultrasound examinations of the pelvis were performed. Transabdominal technique was performed for global imaging of the pelvis including uterus, ovaries, adnexal regions, and pelvic cul-de-sac. It was necessary to proceed with endovaginal exam following the transabdominal exam to visualize the endometrium, uterus and ovaries in better detail. Color and duplex Doppler ultrasound was utilized to evaluate blood flow to the ovaries. COMPARISON:  None. FINDINGS: Uterus Measurements: 6.8 x 4.6 x 3.7 cm. No fibroids or other mass visualized. Endometrium Thickness: 4.1 mm.  No focal abnormality visualized. Right ovary Measurements: 3.3 x 2.5 x 2.0 cm. Normal appearance/no adnexal mass. Left ovary Measurements: 2.9 x 2.2 x 1.6 cm. Normal appearance/no adnexal mass. Pulsed Doppler evaluation of both ovaries demonstrates normal low-resistance arterial and venous waveforms. Other findings No abnormal free fluid. IMPRESSION: Normal examination. Electronically Signed   By: Claudie Revering M.D.   On: 08/01/2016 15:58   Ct Abdomen Pelvis W Contrast  Result Date: 08/01/2016 CLINICAL DATA:  35 year old with right lower quadrant abdominal pain for 3 weeks. Urinary retention for 1 week. Previous appendectomy, cholecystectomy and bilateral tubal ligation. EXAM: CT ABDOMEN AND PELVIS WITH CONTRAST TECHNIQUE: Multidetector CT imaging of the abdomen and pelvis was performed using the standard protocol following bolus administration of intravenous contrast. CONTRAST:  114mL ISOVUE-300 IOPAMIDOL (ISOVUE-300) INJECTION 61% COMPARISON:  Pelvic ultrasound same date. FINDINGS: Lower chest:  Clear lung bases. No significant pleural or pericardial effusion. Hepatobiliary: The liver is normal in density without focal abnormality. Cholecystectomy. Mild extrahepatic biliary prominence, within physiologic limits. Pancreas: Unremarkable. No pancreatic ductal dilatation or surrounding inflammatory changes. Spleen: Normal in size without focal abnormality. Adrenals/Urinary Tract: Both adrenal glands appear normal. The right kidney demonstrates mild cortical thinning and lobularity. There is no hydronephrosis or surrounding inflammatory change. The left kidney appears normal. No evidence of urinary tract calculus. The bladder appears normal. Stomach/Bowel: No evidence of bowel wall thickening, distention or surrounding inflammatory change. Postsurgical changes in the right lower quadrant consistent with previous appendectomy. Moderate stool throughout the colon. Vascular/Lymphatic: There are no enlarged abdominal or pelvic lymph nodes. No significant vascular findings are present. Reproductive: The uterus and ovaries appear normal. No evidence of adnexal mass. Other: No evidence of abdominal wall mass or hernia. No ascites. Musculoskeletal: No acute or significant osseous  findings. There is a small metallic pellet within the soft tissues posterior to the right ischium. IMPRESSION: 1. No acute findings or explanation for right lower quadrant pain. Previous appendectomy. 2. Right renal cortical scarring and lobularity, likely postinflammatory. No hydronephrosis. Electronically Signed   By: Richardean Sale M.D.   On: 08/01/2016 18:04   [place] Art/ven Flow Abd Pelv Doppler  Result Date: 08/01/2016 CLINICAL DATA:  Right pelvic pain for the past 3 weeks. Clinical concern for ovarian torsion. EXAM: TRANSABDOMINAL AND TRANSVAGINAL ULTRASOUND OF PELVIS DOPPLER ULTRASOUND OF OVARIES TECHNIQUE: Both transabdominal and transvaginal ultrasound examinations of the pelvis were performed. Transabdominal technique was performed  for global imaging of the pelvis including uterus, ovaries, adnexal regions, and pelvic cul-de-sac. It was necessary to proceed with endovaginal exam following the transabdominal exam to visualize the endometrium, uterus and ovaries in better detail. Color and duplex Doppler ultrasound was utilized to evaluate blood flow to the ovaries. COMPARISON:  None. FINDINGS: Uterus Measurements: 6.8 x 4.6 x 3.7 cm. No fibroids or other mass visualized. Endometrium Thickness: 4.1 mm.  No focal abnormality visualized. Right ovary Measurements: 3.3 x 2.5 x 2.0 cm. Normal appearance/no adnexal mass. Left ovary Measurements: 2.9 x 2.2 x 1.6 cm. Normal appearance/no adnexal mass. Pulsed Doppler evaluation of both ovaries demonstrates normal low-resistance arterial and venous waveforms. Other findings No abnormal free fluid. IMPRESSION: Normal examination. Electronically Signed   By: Claudie Revering M.D.   On: 08/01/2016 15:58    ____________________________________________   PROCEDURES  Procedure(s) performed:   Procedures   Critical Care performed: No ____________________________________________   INITIAL IMPRESSION / ASSESSMENT AND PLAN / ED COURSE  Pertinent labs & imaging results that were available during my care of the patient were reviewed by me and considered in my medical decision making (see chart for details).  The patient's differential was broad.  The fact that the symptoms of been going on for 3 weeks makes it less likely to be an emergent condition such as ovarian torsion, but she does have a leukocytosis, dyspareunia, vaginal itching, and a heavier menstrual period than usual currently.  She has too numerous to count red blood cells in her urine but she is also currently on her menstrual period and the urinalysis is nitrite negative.  Discussed additional workup at this time and I had my usual customary discussion with her regarding the choice of CT scan versus ultrasound.  Given that her  symptoms are primarily in the pelvis I think that an ultrasound is the best first choice for her.  We will plan to do a pelvic exam as well as perform an ultrasound, and if we need to advance to a CT scan if the pelvic ultrasound is unremarkable, we may do so.  She agrees with this plan.  She is nothing by mouth but I am giving her 2 Percocet for discomfort while we are proceeding with evaluation.   Clinical Course as of Aug 01 1900  Nancy Fetter Aug 01, 2016  1610 Unremarkable ultrasound.  We will proceed with CT scan. US Pelvis Complete [CF]  1811 Reassuring CT scan. CT ABDOMEN PELVIS W CONTRAST [CF]  1825 Pelvic exam is also unremarkable.  The patient again reiterated that her symptoms seem to be getting worse over the last 3 weeks and I explained that I understand but all of my workup has been unremarkable.  Except for the very mild leukocytosis she has no significant abnormalities on her workup and we obtained both ultrasound and CT scan with  oral and IV contrast.  She continues to have normal vital signs.  I encouraged her to establish care with an OB/GYN in follow-up as soon as possible for further evaluation.I gave my usual and customary return precautions. Patient's cervix was very difficult to clearly identify and she does have a significant amount of tenderness on pelvic exam as well as with sexual intercourse.  I will treat her empirically with ceftriaxone and azithromycin because of the benefit outweighs the risk of unnecessary antibiotics in this particular case.  She will be called with the results of the test as a follow up regardless.  [CF]  [id number] Discussed empiric treatment with patient and she agrees with the plan and understands that we are discussing the possibility of STDs.  [CF]  1857 Informed patient of trich results and explained the significance of it being an STD.  She was tearful but understands.  Now I strongly feel that empiric treatment with ceftriaxone and azithromycin is the right  choice, and the patient agrees. Saunders Glance Prep: (!) PRESENT [CF]    Clinical Course User Index [CF] Hinda Kehr, MD    ____________________________________________  FINAL CLINICAL IMPRESSION(S) / ED DIAGNOSES  Final diagnoses:  RLQ abdominal pain  Ovarian torsion  Dyspareunia in female  Pelvic pain in female  Trichomoniasis     MEDICATIONS GIVEN DURING THIS VISIT:  Medications  metroNIDAZOLE (FLAGYL) tablet 500 mg (not administered)  oxyCODONE-acetaminophen (PERCOCET/ROXICET) 5-325 MG per tablet 2 tablet (2 tablets Oral Given 08/01/16 1449)  iopamidol (ISOVUE-300) 61 % injection 30 mL (30 mLs Oral Contrast Given 08/01/16 1622)  iopamidol (ISOVUE-300) 61 % injection 100 mL (100 mLs Intravenous Contrast Given 08/01/16 1743)  cefTRIAXone (ROCEPHIN) injection 250 mg (250 mg Intramuscular Given 08/01/16 1846)  azithromycin (ZITHROMAX) tablet 1,000 mg (1,000 mg Oral Given 08/01/16 1845)     NEW OUTPATIENT MEDICATIONS STARTED DURING THIS VISIT:  New Prescriptions   METRONIDAZOLE (FLAGYL) 500 MG TABLET    Take 1 tablet (500 mg total) by mouth 2 (two) times daily.   ONDANSETRON (ZOFRAN ODT) 4 MG DISINTEGRATING TABLET    Allow 1-2 tablets to dissolve in your mouth every 8 hours as needed for nausea/vomiting    Modified Medications   No medications on file    Discontinued Medications   No medications on file     Note:  This document was prepared using Dragon voice recognition software and may include unintentional dictation errors.    Hinda Kehr, MD 08/01/16 1901

## 2016-08-01 NOTE — Discharge Instructions (Signed)
You have been seen in the Emergency Department (ED) for abdominal/pelvic pain.  Your evaluation did not identify a clear cause of your symptoms but was generally reassuring.  We treated you "just in case" with antibiotics for the possibility of a sexually transmitted disease such as gonorrhea or chlamydia, but you results should be back and you should be called by a nurse if the tests are positive.  HOWEVER, you DO have a sexually transmitted disease called Trichomoniasis, which increases our suspicion that you may have other STDs as well.  Please take the full week-long course of metronidazole (Flagyl) which should cure your infection.  Please remember that your partner(s) should be treated as well, and that you can reacquire the infection others.  Please follow up as instructed above regarding today?s emergent visit and the symptoms that are bothering you.  Return to the ED if your abdominal pain worsens or fails to improve, you develop bloody vomiting, bloody diarrhea, you are unable to tolerate fluids due to vomiting, fever greater than 101, or other symptoms that concern you.

## 2016-08-01 NOTE — ED Notes (Signed)
ED Provider at bedside. 

## 2016-08-01 NOTE — ED Triage Notes (Signed)
Pt presents to ED c/o right side pelvic pain /lower abd pain since 3 weeks ago. Pt states constant progressive stabbing pain ; pt has taken tylenol without relief. Pt also states bladder pressure and vaginal itching without odor and discharge.

## 2016-08-01 NOTE — ED Notes (Signed)
Lab called and asked to run urine pregnancy since it was not done at time of triage.

## 2016-08-02 ENCOUNTER — Telehealth: Payer: Self-pay | Admitting: Emergency Medicine

## 2016-08-02 NOTE — Telephone Encounter (Addendum)
Called patient due to [facility] [address] left message saying patient cannot afford the zofran rx.  I wanted to discuss possibility of goodrx coupon.  She did not answer, so I left message asking her to call me.  Spoke to dr Jimmye Norman.  Can change the rx to phenergan 25 mg tabs--take 1-2 tablets every 4-6 hours as needed for nausea with quantity of 20.  Called to [facility] [address].  Patient called me back and I told her rx would be at [facility].  Also explained goodrx for future needs.  She asked about test results--gave her gc/chlamydia.  She asked if we tested for hiv.  I explained that we did not, but that free testing is available at [facility]--and also for hep c testing.  She took their number.

## 2018-08-04 IMAGING — US US PELVIS COMPLETE
1 series · 13 of 25 positions shown · non-contrast
Comparison: None.

CLINICAL DATA: Right pelvic pain for the past 3 weeks. Clinical
concern for ovarian torsion.

EXAM:
TRANSABDOMINAL AND TRANSVAGINAL ULTRASOUND OF PELVIS
DOPPLER ULTRASOUND OF OVARIES
TECHNIQUE: Both transabdominal and transvaginal ultrasound examinations of the
pelvis were performed. Transabdominal technique was performed for
global imaging of the pelvis including uterus, ovaries, adnexal
regions, and pelvic cul-de-sac.
It was necessary to proceed with endovaginal exam following the
transabdominal exam to visualize the endometrium, uterus and ovaries
in better detail. Color and duplex Doppler ultrasound was utilized
to evaluate blood flow to the ovaries.

[Series 1: us pelvis complete · 0.19mm/px · 13 of 99 slices shown]
[im 1/99]
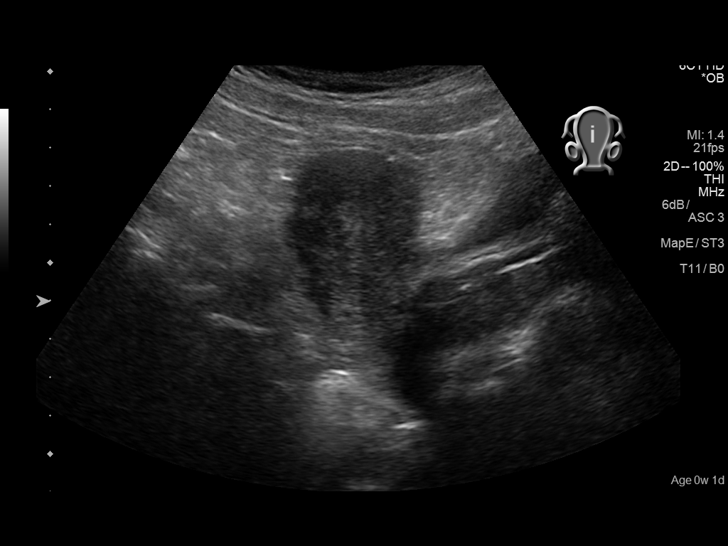
[im 9/99]
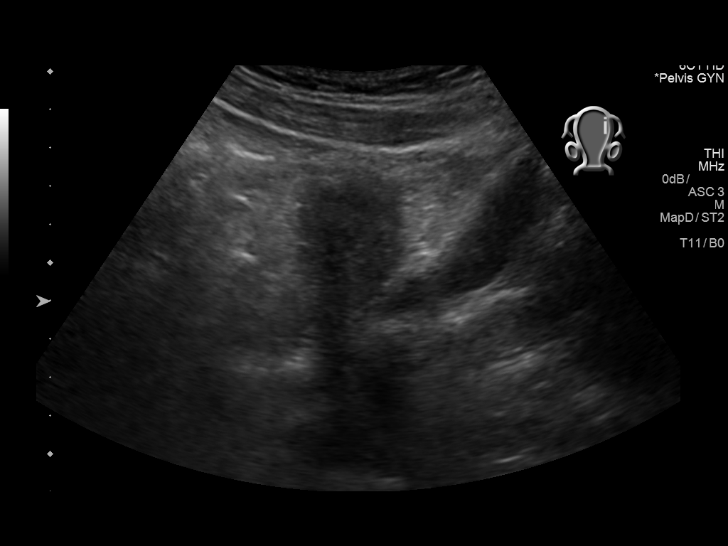
[im 17/99]
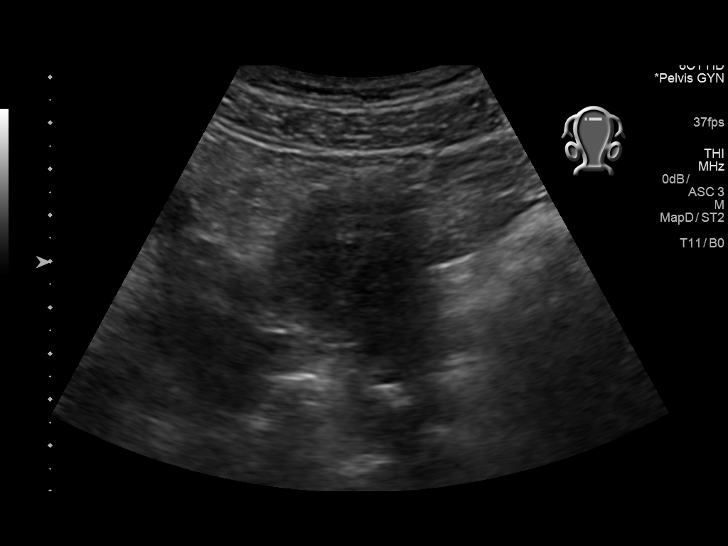
[im 25/99]
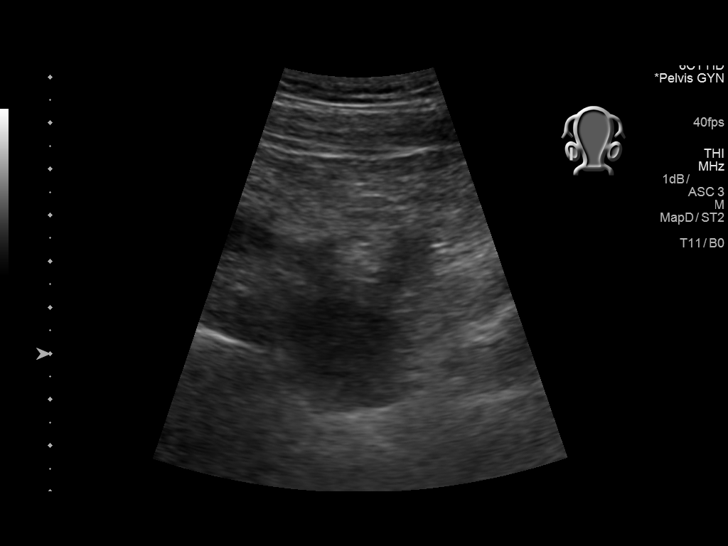
[im 33/99]
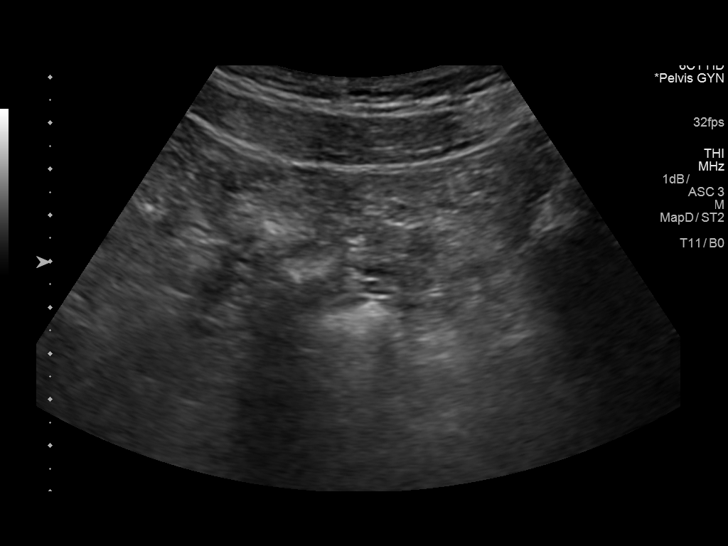
[im 41/99]
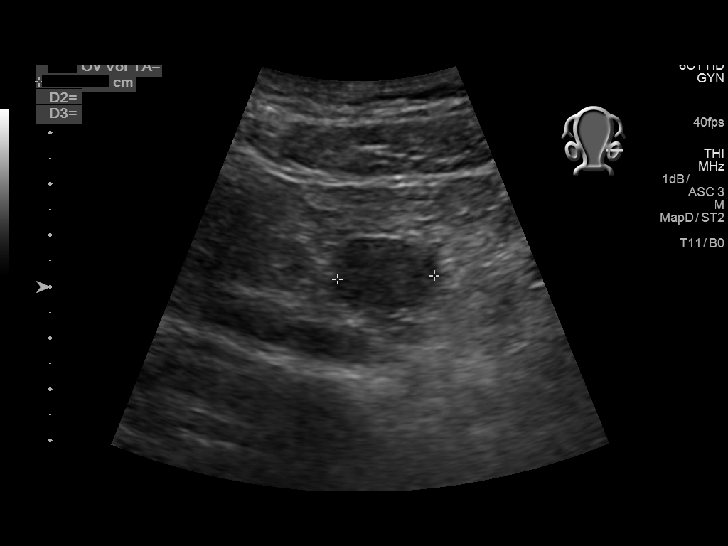
[im 50/99]
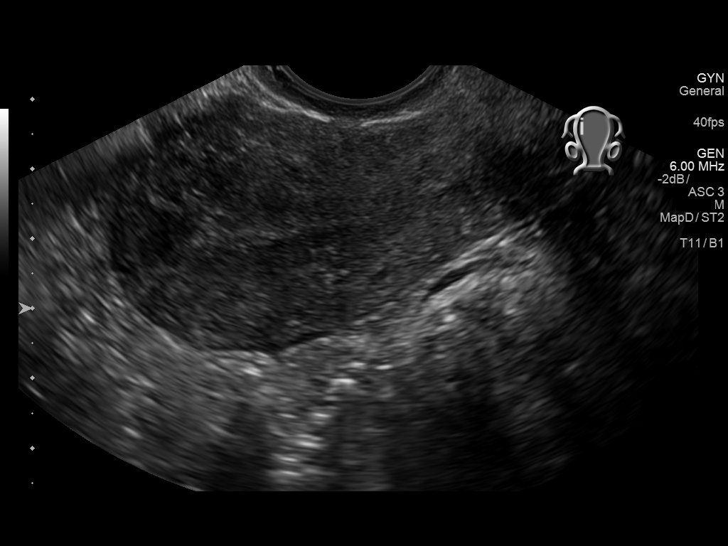
[im 58/99]
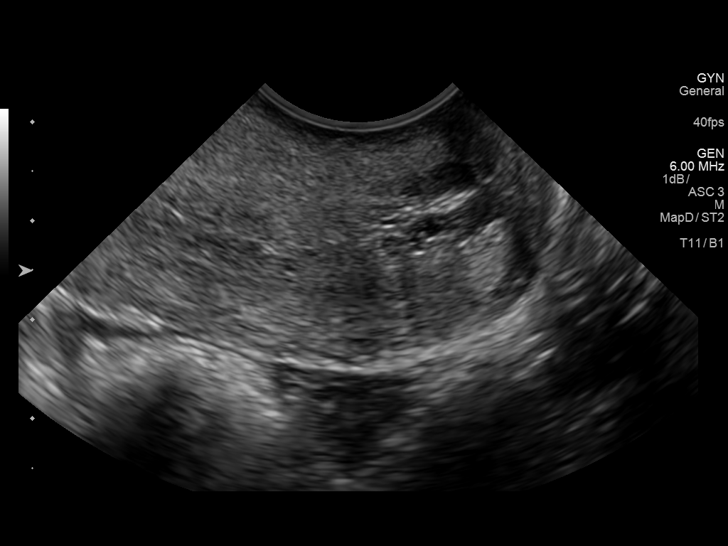
[im 66/99]
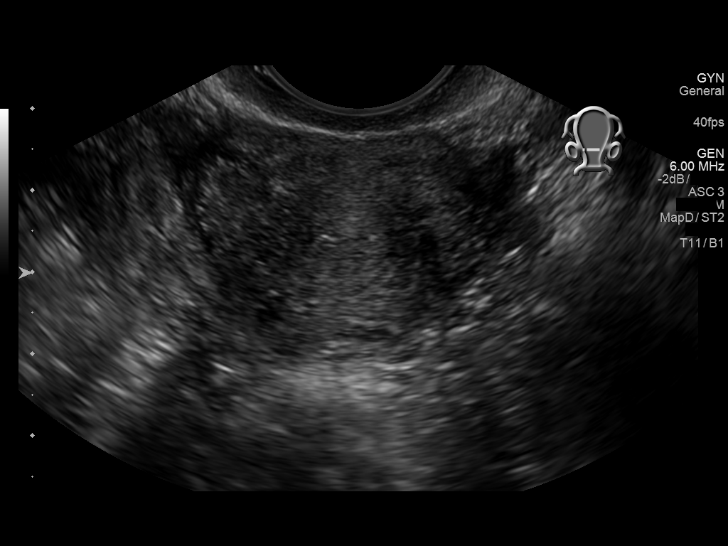
[im 74/99]
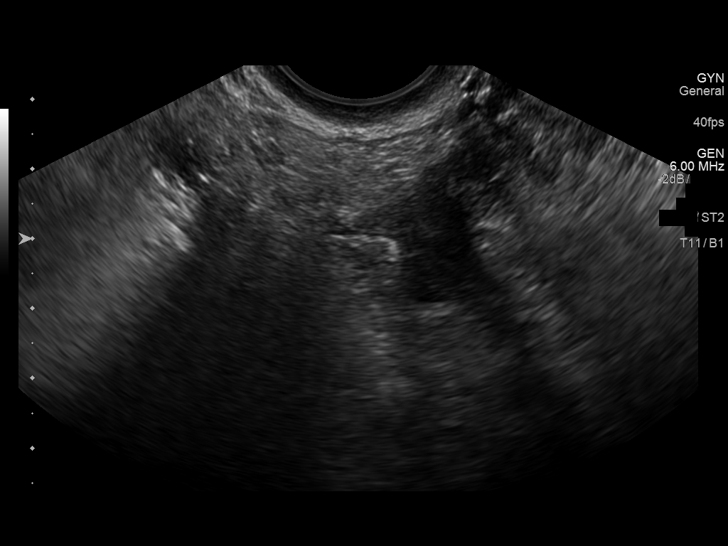
[im 82/99]
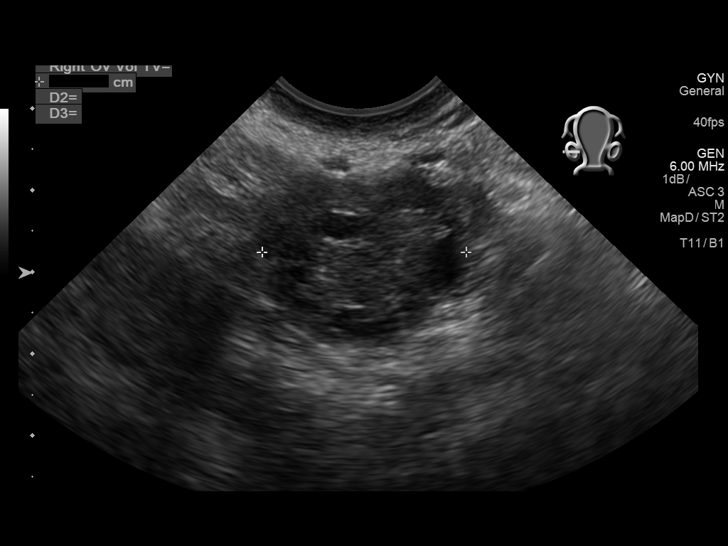
[im 90/99]
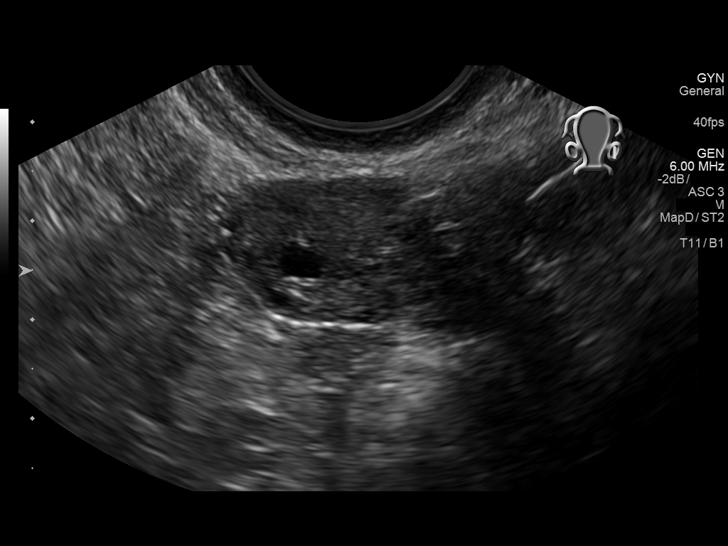
[im 99/99]
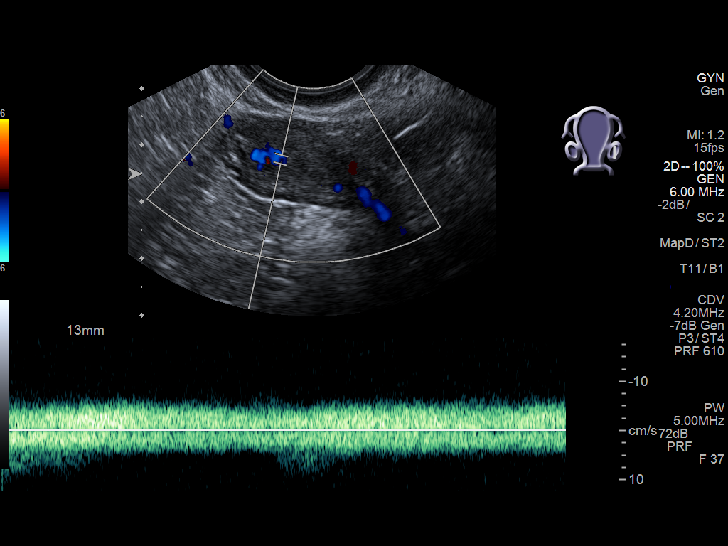

[13 of 25 positions shown; findings below may reference images not displayed]

FINDINGS: Uterus

Measurements: 6.8 x 4.6 x 3.7 cm. No fibroids or other mass
visualized.

Endometrium

Thickness: 4.1 mm.  No focal abnormality visualized.

Right ovary

Measurements: 3.3 x 2.5 x 2.0 cm. Normal appearance/no adnexal mass.

Left ovary

Measurements: 2.9 x 2.2 x 1.6 cm. Normal appearance/no adnexal mass.

Pulsed Doppler evaluation of both ovaries demonstrates normal
low-resistance arterial and venous waveforms.

Other findings

No abnormal free fluid.
IMPRESSION: Normal examination.

## 2018-10-20 IMAGING — CT CT ABD-PELV W/ CM
2 of 4 series · 16 of 46 positions shown, 18 images · IV contrast (APPLIED)
Comparison: Pelvic ultrasound same date.

CLINICAL DATA: 34-year-old with right lower quadrant abdominal pain
for 3 weeks. Urinary retention for 1 week. Previous appendectomy,
cholecystectomy and bilateral tubal ligation.

EXAM:
CT ABDOMEN AND PELVIS WITH CONTRAST
TECHNIQUE: Multidetector CT imaging of the abdomen and pelvis was performed
using the standard protocol following bolus administration of
intravenous contrast.
CONTRAST:  100mL OMT76I-IGG IOPAMIDOL (OMT76I-IGG) INJECTION 61%

[Series 2: axial st · axial · 0.70mm/px · z∈[-499,-104]mm · 13 of 87 slices shown, 15 images]
[im 4/87  soft-tissue]
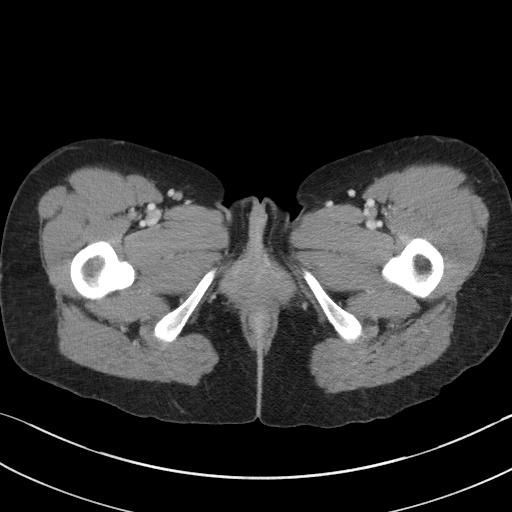
[im 4/87  bone]
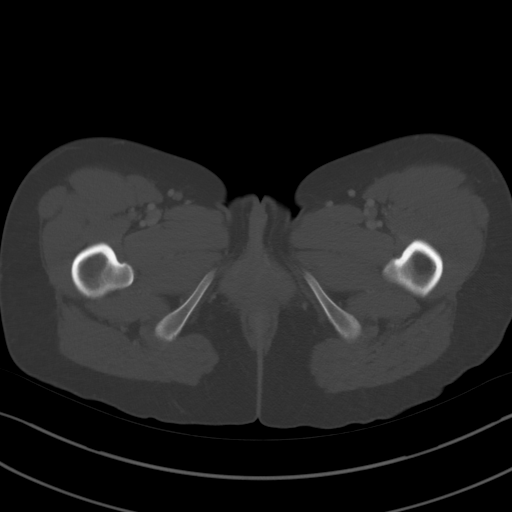
[im 10/87  soft-tissue]
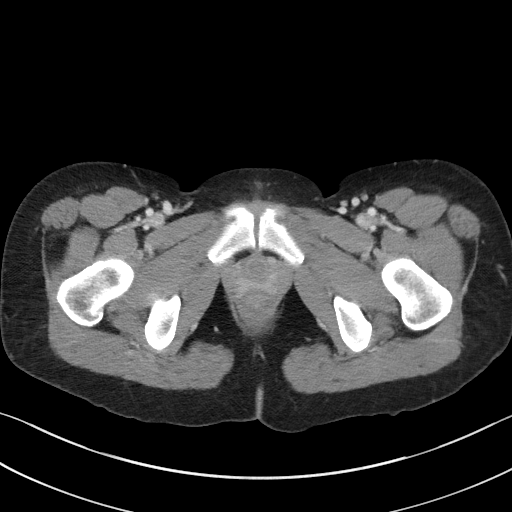
[im 17/87  soft-tissue]
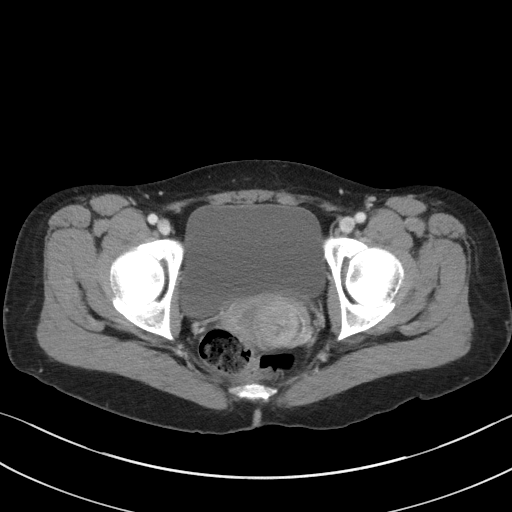
[im 24/87  soft-tissue]
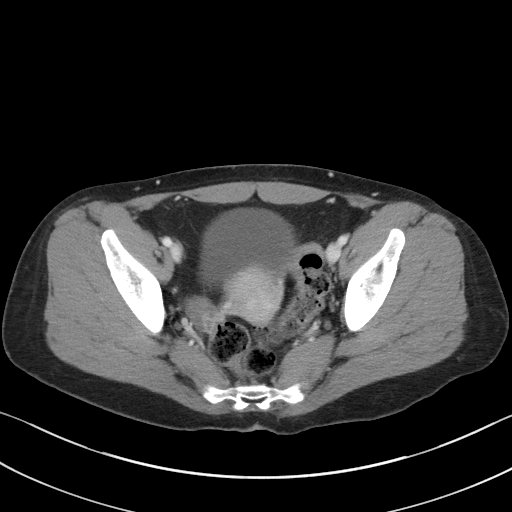
[im 30/87  soft-tissue]
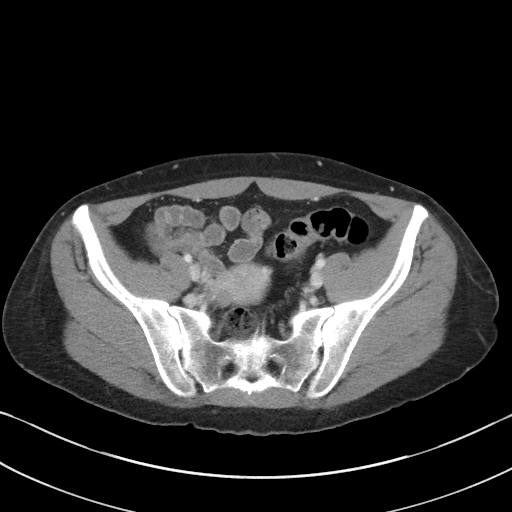
[im 37/87  soft-tissue]
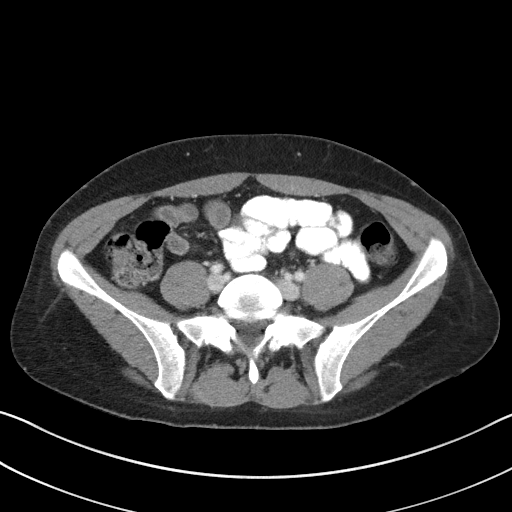
[im 44/87  soft-tissue]
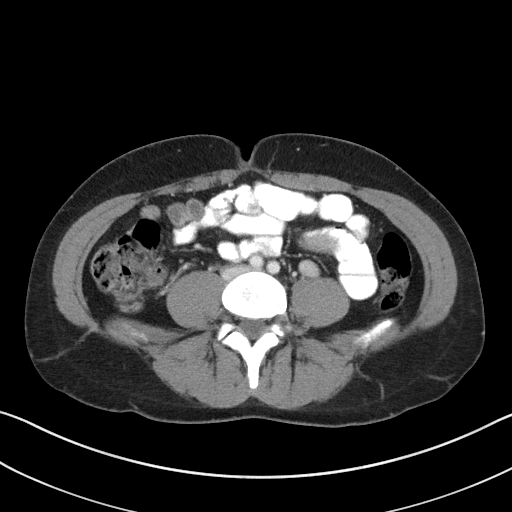
[im 50/87  soft-tissue]
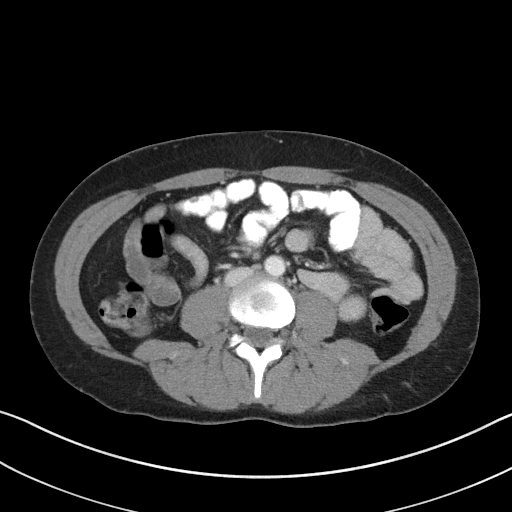
[im 57/87  soft-tissue]
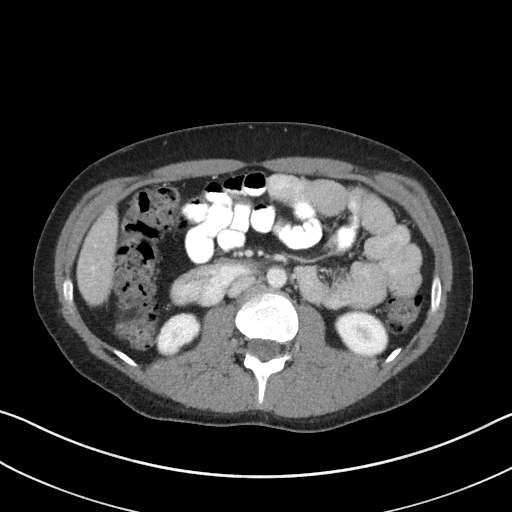
[im 57/87  bone]
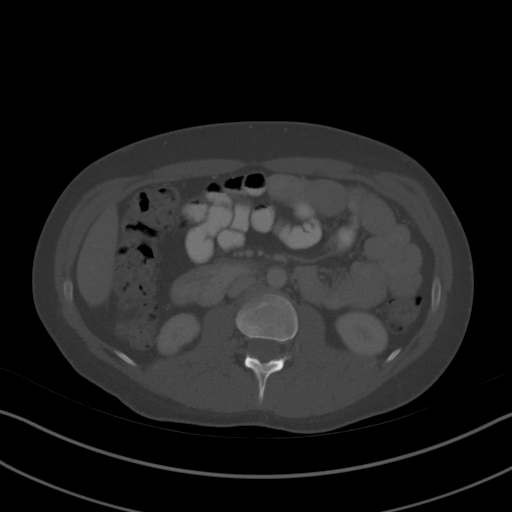
[im 63/87  soft-tissue]
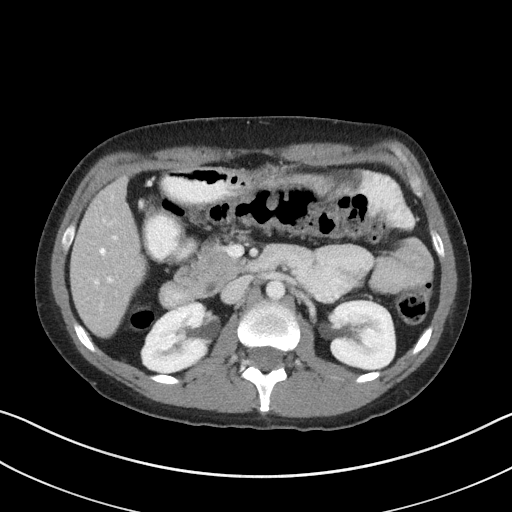
[im 70/87  soft-tissue]
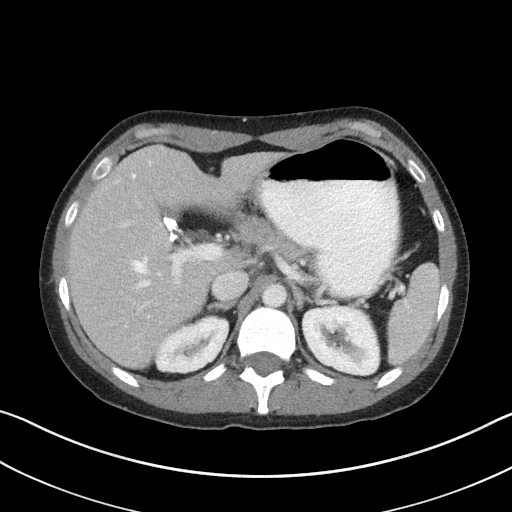
[im 77/87  soft-tissue]
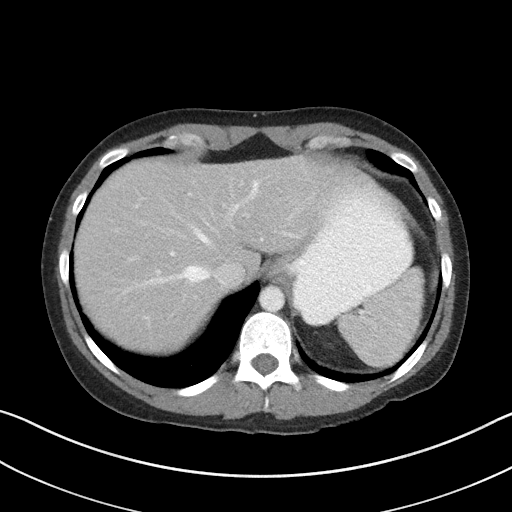
[im 83/87  soft-tissue]
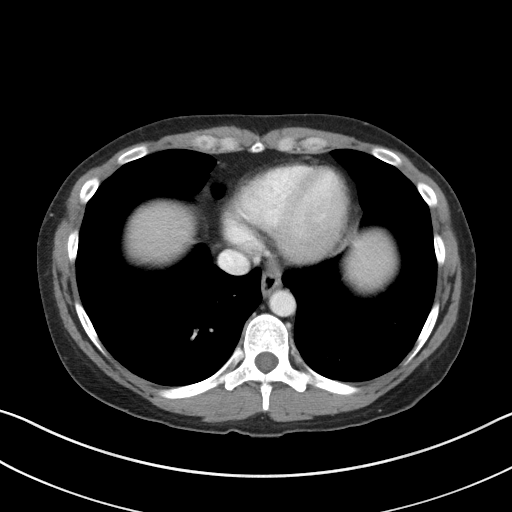

[Series 5: coronal st · coronal · 0.66mm/px · 3 of 81 slices shown]
[im 27/81  soft-tissue]
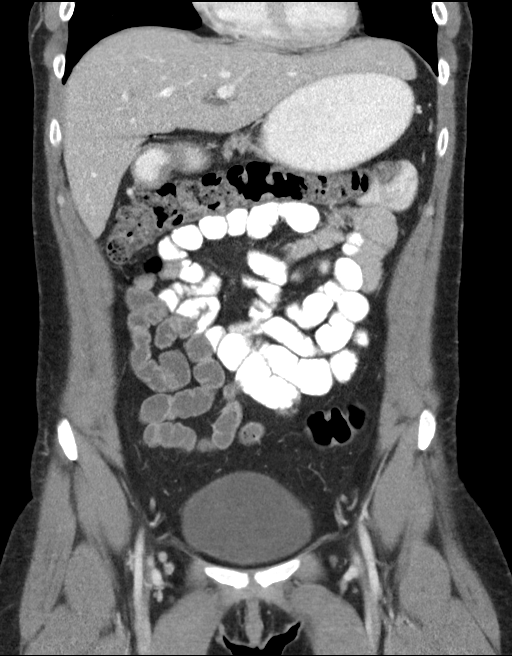
[im 36/81  soft-tissue]
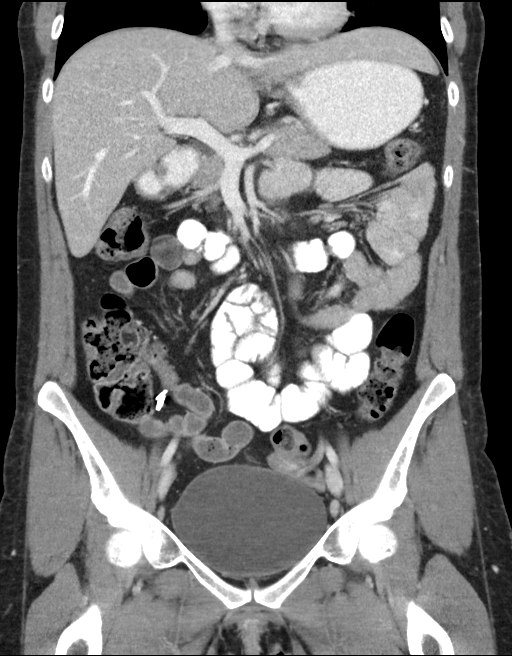
[im 45/81  soft-tissue]
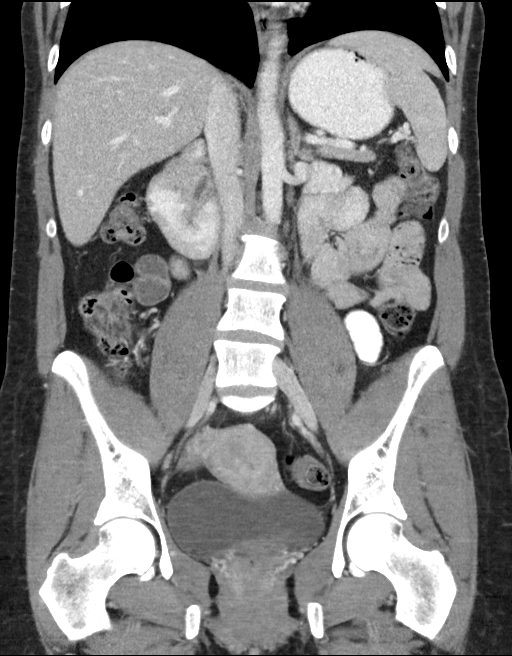

[16 of 46 positions shown; findings below may reference images not displayed]

FINDINGS: Lower chest: Clear lung bases. No significant pleural or pericardial
effusion.

Hepatobiliary: The liver is normal in density without focal
abnormality. Cholecystectomy. Mild extrahepatic biliary prominence,
within physiologic limits.

Pancreas: Unremarkable. No pancreatic ductal dilatation or
surrounding inflammatory changes.

Spleen: Normal in size without focal abnormality.

Adrenals/Urinary Tract: Both adrenal glands appear normal. The right
kidney demonstrates mild cortical thinning and lobularity. There is
no hydronephrosis or surrounding inflammatory change. The left
kidney appears normal. No evidence of urinary tract calculus. The
bladder appears normal.

Stomach/Bowel: No evidence of bowel wall thickening, distention or
surrounding inflammatory change. Postsurgical changes in the right
lower quadrant consistent with previous appendectomy. Moderate stool
throughout the colon.

Vascular/Lymphatic: There are no enlarged abdominal or pelvic lymph
nodes. No significant vascular findings are present.

Reproductive: The uterus and ovaries appear normal. No evidence of
adnexal mass.

Other: No evidence of abdominal wall mass or hernia. No ascites.

Musculoskeletal: No acute or significant osseous findings. There is
a small metallic pellet within the soft tissues posterior to the
right ischium.
IMPRESSION: 1. No acute findings or explanation for right lower quadrant pain.
Previous appendectomy.
2. Right renal cortical scarring and lobularity, likely
postinflammatory. No hydronephrosis.

## 2018-11-16 ENCOUNTER — Other Ambulatory Visit: Payer: Self-pay

## 2018-11-16 ENCOUNTER — Encounter: Payer: Self-pay | Admitting: Emergency Medicine

## 2018-11-16 ENCOUNTER — Emergency Department: Payer: Self-pay

## 2018-11-16 DIAGNOSIS — N939 Abnormal uterine and vaginal bleeding, unspecified: Secondary | ICD-10-CM | POA: Insufficient documentation

## 2018-11-16 DIAGNOSIS — Z79899 Other long term (current) drug therapy: Secondary | ICD-10-CM | POA: Insufficient documentation

## 2018-11-16 DIAGNOSIS — G8929 Other chronic pain: Secondary | ICD-10-CM | POA: Insufficient documentation

## 2018-11-16 DIAGNOSIS — F17210 Nicotine dependence, cigarettes, uncomplicated: Secondary | ICD-10-CM | POA: Insufficient documentation

## 2018-11-16 DIAGNOSIS — R1031 Right lower quadrant pain: Secondary | ICD-10-CM | POA: Insufficient documentation

## 2018-11-16 LAB — CBC WITH DIFFERENTIAL/PLATELET
ABS IMMATURE GRANULOCYTES: 0.02 10*3/uL (ref 0.00–0.07)
BASOS PCT: 1 %
Basophils Absolute: 0.1 10*3/uL (ref 0.0–0.1)
Eosinophils Absolute: 0.2 10*3/uL (ref 0.0–0.5)
Eosinophils Relative: 2 %
HEMATOCRIT: 44.4 % (ref 36.0–46.0)
HEMOGLOBIN: 14.7 g/dL (ref 12.0–15.0)
Immature Granulocytes: 0 %
LYMPHS ABS: 3.7 10*3/uL (ref 0.7–4.0)
Lymphocytes Relative: 33 %
MCH: 28 pg (ref 26.0–34.0)
MCHC: 33.1 g/dL (ref 30.0–36.0)
MCV: 84.6 fL (ref 80.0–100.0)
MONO ABS: 0.7 10*3/uL (ref 0.1–1.0)
MONOS PCT: 6 %
NEUTROS ABS: 6.6 10*3/uL (ref 1.7–7.7)
Neutrophils Relative %: 58 %
Platelets: 364 10*3/uL (ref 150–400)
RBC: 5.25 MIL/uL — ABNORMAL HIGH (ref 3.87–5.11)
RDW: 13.4 % (ref 11.5–15.5)
WBC: 11.2 10*3/uL — ABNORMAL HIGH (ref 4.0–10.5)
nRBC: 0 % (ref 0.0–0.2)

## 2018-11-16 LAB — COMPREHENSIVE METABOLIC PANEL
ALT: 22 U/L (ref 0–44)
AST: 21 U/L (ref 15–41)
Albumin: 4 g/dL (ref 3.5–5.0)
Alkaline Phosphatase: 69 U/L (ref 38–126)
Anion gap: 9 (ref 5–15)
BILIRUBIN TOTAL: 0.3 mg/dL (ref 0.3–1.2)
BUN: 15 mg/dL (ref 6–20)
CALCIUM: 8.8 mg/dL — AB (ref 8.9–10.3)
CO2: 22 mmol/L (ref 22–32)
CREATININE: 0.89 mg/dL (ref 0.44–1.00)
Chloride: 107 mmol/L (ref 98–111)
GFR calc non Af Amer: >60 mL/min (ref >60)
Glucose, Bld: 99 mg/dL (ref 70–99)
Potassium: 3.7 mmol/L (ref 3.5–5.1)
Sodium: 138 mmol/L (ref 135–145)
Total Protein: 7.1 g/dL (ref 6.5–8.1)

## 2018-11-16 LAB — URINALYSIS, COMPLETE (UACMP) WITH MICROSCOPIC
BACTERIA UA: NONE SEEN
Bilirubin Urine: NEGATIVE
Glucose, UA: NEGATIVE mg/dL
Ketones, ur: NEGATIVE mg/dL
Leukocytes,Ua: NEGATIVE
Nitrite: NEGATIVE
PROTEIN: NEGATIVE mg/dL
SPECIFIC GRAVITY, URINE: 1.017 (ref 1.005–1.030)
pH: 6 (ref 5.0–8.0)

## 2018-11-16 LAB — POCT PREGNANCY, URINE: Preg Test, Ur: NEGATIVE

## 2018-11-16 NOTE — ED Triage Notes (Signed)
Patient ambulatory to triage with steady gait, without difficulty or distress noted; pt reports vag bleeding after having sexual intercourse with rt lower abd pain; IUD in place

## 2018-11-17 ENCOUNTER — Emergency Department
Admission: EM | Admit: 2018-11-17 | Discharge: 2018-11-17 | Disposition: A | Discharge status: Home / Self Care | Payer: Self-pay | Attending: Emergency Medicine | Admitting: Emergency Medicine

## 2018-11-17 DIAGNOSIS — N939 Abnormal uterine and vaginal bleeding, unspecified: Secondary | ICD-10-CM

## 2018-11-17 DIAGNOSIS — G8929 Other chronic pain: Secondary | ICD-10-CM

## 2018-11-17 DIAGNOSIS — R102 Pelvic and perineal pain: Secondary | ICD-10-CM

## 2018-11-17 HISTORY — DX: Benign neoplasm of connective and other soft tissue, unspecified: D21.9

## 2018-11-17 NOTE — Discharge Instructions (Signed)
Your workup in the Emergency Department today was reassuring.  We did not find any specific abnormalities.  We recommend that you avoid intercourse or any other objects placed in the vagina, at least until you have not had any vaginal bleeding for a couple of days (known as "pelvic rest").  You declined a pelvic exam tonight so we cannot be certain you do not have a laceration that may need time to heal.  Otherwise we recommend you follow-up either with a local OB/GYN when you are able to get an appointment or with your OB/GYN provider at [facility].  Please use over-the-counter ibuprofen and/or Tylenol according to label instructions for pain relief.  Return to the Emergency Department if you develop new or worsening symptoms that concern you.

## 2018-11-17 NOTE — ED Provider Notes (Signed)
[facility] Emergency Department Provider Note  ____________________________________________   First MD Initiated Contact with Patient 11/17/18 0133     (approximate)  I have reviewed the triage vital signs and the nursing notes.   HISTORY  Chief Complaint Vaginal Bleeding    HPI Katrina Nguyen is a 38 y.o. female with chronic pelvic/right lower quadrant pain as per the medical record and her own report.  She presents by private vehicle for evaluation of acute worsening of her pelvic pain with bright red vaginal bleeding after having sex tonight.  She reports that this is been happening for about 2-1/2 years and has been worse over the last year.  She has not yet been to an OB/GYN but has seen her primary care doctor multiple times and has had at least 4 pelvic exams since having an IUD placed.  The IUD was placed in an attempt to help with the intermittent vaginal bleeding as well as to treat her fibroids seen on a prior ultrasound.  She reports that she only has vaginal bleeding after intercourse.  Tonight after having intercourse the blood was heavier than usual and required multiple pads and she was told by her doctor that if that ever happen she should come to the emergency department to make sure that the IUD is in place and has not perforated her uterus.  She is very frustrated because she reports that the pain is severe though intermittent and that no one can help her and identify the problem.  She has had multiple pelvic exams, multiple test for STDs in the recent past, and prior ultrasounds and CT scans.  She was referred to [place] and had an appointment in a week but that appointment was canceled due to the COVID-19 pandemic.  She is very frustrated and tearful and reports some persistent pain but the pain currently is about the same as the pain she has been dealing with previously.  She denies fever/chills, chest pain, shortness of breath, nausea,  vomiting, and dysuria.         Past Medical History:  Diagnosis Date   Fibroid tumor     There are no active problems to display for this patient.   Past Surgical History:  Procedure Laterality Date   APPENDECTOMY     CHOLECYSTECTOMY     TONSILLECTOMY     tubal      Prior to Admission medications   Medication Sig Start Date End Date Taking? Authorizing Provider  albuterol (PROVENTIL HFA;VENTOLIN HFA) 108 (90 Base) MCG/ACT inhaler Inhale 2 puffs into the lungs every 6 (six) hours as needed for wheezing or shortness of breath. 10/04/15   Lavonia Drafts, MD  amoxicillin (AMOXIL) 500 MG tablet Take 1 tablet (500 mg total) by mouth 3 (three) times daily. 01/10/16   Triplett, Cari B, FNP  fluticasone (VERAMYST) 27.5 MCG/SPRAY nasal spray Place 1 spray into the nose daily. 01/10/16   Triplett, Cari B, FNP  guaiFENesin-codeine (ROBITUSSIN AC) 100-10 MG/5ML syrup Take 5 mLs by mouth 3 (three) times daily as needed for cough. 01/10/16   Triplett, Johnette Abraham B, FNP  metroNIDAZOLE (FLAGYL) 500 MG tablet Take 1 tablet (500 mg total) by mouth 2 (two) times daily. 08/01/16   Hinda Kehr, MD  ondansetron (ZOFRAN ODT) 4 MG disintegrating tablet Allow 1-2 tablets to dissolve in your mouth every 8 hours as needed for nausea/vomiting 08/01/16   Hinda Kehr, MD  predniSONE (DELTASONE) 50 MG tablet Take 1 tablet (50 mg total)  by mouth daily with breakfast. 10/04/15   Lavonia Drafts, MD    Allergies Patient has no known allergies.  No family history on file.  Social History Social History   Tobacco Use   Smoking status: Current Every Day Smoker    Types: Cigarettes   Smokeless tobacco: Never Used  Substance Use Topics   Alcohol use: No   Drug use: No    Review of Systems Constitutional: No fever/chills Eyes: No visual changes. ENT: No sore throat. Cardiovascular: Denies chest pain. Respiratory: Denies shortness of breath. Gastrointestinal: Suprapubic and right lower quadrant abdominal  pain as described above..  No nausea, no vomiting.  No diarrhea.  No constipation. Genitourinary: Post coital vaginal bleeding and pelvic pain as described above with IUD in place. Musculoskeletal: Negative for neck pain.  Negative for back pain. Integumentary: Negative for rash. Neurological: Negative for headaches, focal weakness or numbness.   ____________________________________________   PHYSICAL EXAM:  VITAL SIGNS: ED Triage Vitals  Enc Vitals Group     BP 11/16/18 2201 (!) 151/105     Pulse Rate 11/16/18 2201 90     Resp 11/16/18 2201 18     Temp 11/16/18 2201 98.3 F (36.8 C)     Temp Source 11/16/18 2201 Oral     SpO2 11/16/18 2201 100 %     Weight 11/16/18 2200 71.7 kg (158 lb)     Height 11/16/18 2200 1.549 m (5\' 1" )     Head Circumference --      Peak Flow --      Pain Score 11/16/18 2200 8     Pain Loc --      Pain Edu? --      Excl. in [place]? --     Constitutional: Alert and oriented. Well appearing and in no acute distress but is tearful and upset. Eyes: Conjunctivae are normal.  Head: Atraumatic. Nose: No congestion/rhinnorhea. Mouth/Throat: Mucous membranes are moist. Neck: No stridor.  No meningeal signs.   Cardiovascular: Normal rate, regular rhythm. Good peripheral circulation. Grossly normal heart sounds. Respiratory: Normal respiratory effort.  No retractions. Lungs CTAB. Gastrointestinal: Soft and nontender. No distention.  Genitourinary: Patient refuses pelvic exam (see hospital course below) Musculoskeletal: No lower extremity tenderness nor edema. No gross deformities of extremities. Neurologic:  Normal speech and language. No gross focal neurologic deficits are appreciated.  Skin:  Skin is warm, dry and intact. No rash noted. Psychiatric: Mood and affect are tearful and upset but generally appropriate under the circumstances.  ____________________________________________   LABS (all labs ordered are listed, but only abnormal results are  displayed)  Labs Reviewed  CBC WITH DIFFERENTIAL/PLATELET - Abnormal; Notable for the following components:      Result Value   WBC 11.2 (*)    RBC 5.25 (*)    All other components within normal limits  COMPREHENSIVE METABOLIC PANEL - Abnormal; Notable for the following components:   Calcium 8.8 (*)    All other components within normal limits  URINALYSIS, COMPLETE (UACMP) WITH MICROSCOPIC  POCT PREGNANCY, URINE   ____________________________________________  EKG  No indication for EKG ____________________________________________  RADIOLOGY   ED MD interpretation: As per the radiology report, the pelvic ultrasound is normal with no sign of acute abnormality and no sign of fibroid.  The radiologist reports that the IUD is in appropriate position within the endometrial cavity.  Official radiology report(s): US Pelvis Transvanginal Non-ob (tv Only)  Result Date: 11/16/2018 CLINICAL DATA:  Initial evaluation for acute post coital vaginal  bleeding, right pelvic pain. EXAM: TRANSABDOMINAL AND TRANSVAGINAL ULTRASOUND OF PELVIS TECHNIQUE: Both transabdominal and transvaginal ultrasound examinations of the pelvis were performed. Transabdominal technique was performed for global imaging of the pelvis including uterus, ovaries, adnexal regions, and pelvic cul-de-sac. It was necessary to proceed with endovaginal exam following the transabdominal exam to visualize the uterus, endometrium, and ovaries. COMPARISON:  Prior CT from 08/01/2016 FINDINGS: Uterus Measurements: 8.2 x 4.3 x 5.6 cm = volume: 10.3 mL. No fibroids or other mass visualized. Endometrium Thickness: 8.1 mm. No focal abnormality visualized. IUD in appropriate position within the endometrial cavity. Right ovary Measurements: 3.2 x 2.1 x 2.5 cm = volume: 8.6 mL. Normal appearance/no adnexal mass. Left ovary Measurements: 2.8 x 1.6 x 2.3 cm = volume: 5.3 mL. Normal appearance/no adnexal mass. Other findings No abnormal free fluid.  IMPRESSION: 1. Normal pelvic ultrasound with no acute abnormality identified. 2. IUD in appropriate position within the endometrial cavity. Electronically Signed   By: Jeannine Boga M.D.   On: 11/16/2018 23:42   US Pelvis Complete  Result Date: 11/16/2018 CLINICAL DATA:  Initial evaluation for acute post coital vaginal bleeding, right pelvic pain. EXAM: TRANSABDOMINAL AND TRANSVAGINAL ULTRASOUND OF PELVIS TECHNIQUE: Both transabdominal and transvaginal ultrasound examinations of the pelvis were performed. Transabdominal technique was performed for global imaging of the pelvis including uterus, ovaries, adnexal regions, and pelvic cul-de-sac. It was necessary to proceed with endovaginal exam following the transabdominal exam to visualize the uterus, endometrium, and ovaries. COMPARISON:  Prior CT from 08/01/2016 FINDINGS: Uterus Measurements: 8.2 x 4.3 x 5.6 cm = volume: 10.3 mL. No fibroids or other mass visualized. Endometrium Thickness: 8.1 mm. No focal abnormality visualized. IUD in appropriate position within the endometrial cavity. Right ovary Measurements: 3.2 x 2.1 x 2.5 cm = volume: 8.6 mL. Normal appearance/no adnexal mass. Left ovary Measurements: 2.8 x 1.6 x 2.3 cm = volume: 5.3 mL. Normal appearance/no adnexal mass. Other findings No abnormal free fluid. IMPRESSION: 1. Normal pelvic ultrasound with no acute abnormality identified. 2. IUD in appropriate position within the endometrial cavity. Electronically Signed   By: Jeannine Boga M.D.   On: 11/16/2018 23:42    ____________________________________________   PROCEDURES   Procedure(s) performed (including Critical Care):  Procedures   ____________________________________________   INITIAL IMPRESSION / MDM / [employer] / ED COURSE  As part of my medical decision making, I reviewed the following data within the [place] notes reviewed and incorporated, Labs reviewed , Old chart  reviewed, Notes from prior ED visits and [place] Controlled Substance Database         Differential diagnosis includes, but is not limited to, chronic pelvic pain and chronic dyspareunia, fibroids, dysfunctional uterine bleeding, uterine perforation, STD/PID.  Much less likely ovarian torsion and the patient has had a prior appendectomy.  Patient reports that she has had multiple STD tests and she had a pelvic exam as recently as 2 weeks ago.  I repeatedly offered to perform a pelvic exam with explicit and expressed reason to verify that she does not have a vaginal or cervical laceration that would explain the acute onset of bleeding tonight that is more severe than usual after intercourse, but she does not want a pelvic exam tonight.  She is frustrated understandably that she is not able to get a diagnosis and even more frustrated that her OB/GYN appointment at [facility] was canceled.  I explained why I thought the pelvic exam was important but respect her decision and  right of refusal given that she has the capacity to make her own decisions.  I will give her the name and number of a local OB/GYN in case they are still having office hours and encouraged her to follow-up as an outpatient but at this time I do not have a specific explanation for her symptoms.  Her lab work is generally reassuring with only very mild leukocytosis that is nonspecific, normal metabolic panel, negative urine pregnancy, and a reassuring pelvic ultrasound.  Her urinalysis is pending and I asked her to wait for the results, but she now wants to leave immediately so it is unclear if she will await the results or not.  Regardless, I gave her my usual and customary return precautions and she will follow up as an outpatient.  Clinical Course as of Nov 16 216  Fri Nov 17, 2018  0218 Urinalysis results were faxed by the lab and there is evidence of hemoglobin but no sign of acute infection.  No indication for antibiotics.   [CF]    Clinical  Course User Index [CF] Hinda Kehr, MD    ____________________________________________  FINAL CLINICAL IMPRESSION(S) / ED DIAGNOSES  Final diagnoses:  Chronic pelvic pain in female  Abnormal vaginal bleeding     MEDICATIONS GIVEN DURING THIS VISIT:  Medications - No data to display   ED Discharge Orders    None       Note:  This document was prepared using Dragon voice recognition software and may include unintentional dictation errors.   Hinda Kehr, MD 11/17/18 [phone]

## 2018-11-17 NOTE — ED Notes (Signed)
Pt states that earlier today she was having lower abdominal pain that is still present after intercourse. Pt states that she has had pain for a while and "doesn't stand why anyone can't figure out what is wrong."

## 2018-11-17 NOTE — ED Notes (Signed)
ED Provider at bedside. 

## 2019-04-29 ENCOUNTER — Emergency Department
Admission: EM | Admit: 2019-04-29 | Discharge: 2019-04-29 | Disposition: A | Discharge status: Home / Self Care | Payer: Self-pay | Attending: Emergency Medicine | Admitting: Emergency Medicine

## 2019-04-29 DIAGNOSIS — H60391 Other infective otitis externa, right ear: Secondary | ICD-10-CM | POA: Insufficient documentation

## 2019-04-29 DIAGNOSIS — F17210 Nicotine dependence, cigarettes, uncomplicated: Secondary | ICD-10-CM | POA: Insufficient documentation

## 2019-04-29 MED ORDER — NEOMYCIN-POLYMYXIN-HC 1 % OT SOLN
4.0000 [drp] | Freq: Four times a day (QID) | OTIC | Status: DC
  Filled 2019-04-29 (×2): qty 10

## 2019-04-29 MED ORDER — NEOMYCIN-POLYMYXIN-HC 3.5-10000-1 OT SOLN: 4.0000 [drp] | Freq: Four times a day (QID) | OTIC | 0 refills | Status: AC

## 2019-04-29 NOTE — ED Triage Notes (Signed)
C/O right ear pain x 2 days.  Sates she feels like something is stuck down ear.

## 2019-04-29 NOTE — ED Provider Notes (Signed)
[facility] Emergency Department Provider Note  ____________________________________________  Time seen: Approximately 6:43 PM  I have reviewed the triage vital signs and the nursing notes.   HISTORY  Chief Complaint Otalgia    HPI Katrina Nguyen is a 38 y.o. female who presents the emergency department complaining of sharp right ear pain.  Patient reports that the pain feels "both in my ear as well as the outer part of my ear."  Patient denies any recent trauma to the ear.  No recent swimming.  No fevers or chills.  No hearing changes.  No other complaints other than right ear pain.         Past Medical History:  Diagnosis Date  . Fibroid tumor     There are no active problems to display for this patient.   Past Surgical History:  Procedure Laterality Date  . APPENDECTOMY    . CHOLECYSTECTOMY    . TONSILLECTOMY    . tubal      Prior to Admission medications   Medication Sig Start Date End Date Taking? Authorizing Provider  albuterol (PROVENTIL HFA;VENTOLIN HFA) 108 (90 Base) MCG/ACT inhaler Inhale 2 puffs into the lungs every 6 (six) hours as needed for wheezing or shortness of breath. 10/04/15   Lavonia Drafts, MD  amoxicillin (AMOXIL) 500 MG tablet Take 1 tablet (500 mg total) by mouth 3 (three) times daily. 01/10/16   Triplett, Cari B, FNP  fluticasone (VERAMYST) 27.5 MCG/SPRAY nasal spray Place 1 spray into the nose daily. 01/10/16   Triplett, Cari B, FNP  guaiFENesin-codeine (ROBITUSSIN AC) 100-10 MG/5ML syrup Take 5 mLs by mouth 3 (three) times daily as needed for cough. 01/10/16   Triplett, Johnette Abraham B, FNP  metroNIDAZOLE (FLAGYL) 500 MG tablet Take 1 tablet (500 mg total) by mouth 2 (two) times daily. 08/01/16   Hinda Kehr, MD  neomycin-polymyxin-hydrocortisone (CORTISPORIN) OTIC solution Place 4 drops into the right ear 4 (four) times daily for 7 days. 04/29/19 05/06/19  Burnett Lieber, Charline Bills, PA-C  ondansetron (ZOFRAN ODT) 4 MG disintegrating  tablet Allow 1-2 tablets to dissolve in your mouth every 8 hours as needed for nausea/vomiting 08/01/16   Hinda Kehr, MD  predniSONE (DELTASONE) 50 MG tablet Take 1 tablet (50 mg total) by mouth daily with breakfast. 10/04/15   Lavonia Drafts, MD    Allergies Patient has no known allergies.  No family history on file.  Social History Social History   Tobacco Use  . Smoking status: Current Every Day Smoker    Types: Cigarettes  . Smokeless tobacco: Never Used  Substance Use Topics  . Alcohol use: No  . Drug use: No     Review of Systems  Constitutional: No fever/chills Eyes: No visual changes. No discharge ENT: Positive for right ear pain Cardiovascular: no chest pain. Respiratory: no cough. No SOB. Gastrointestinal: No abdominal pain.  No nausea, no vomiting.  Musculoskeletal: Negative for musculoskeletal pain. Skin: Negative for rash, abrasions, lacerations, ecchymosis. Neurological: Negative for headaches, focal weakness or numbness. 10-point ROS otherwise negative.  ____________________________________________   PHYSICAL EXAM:  VITAL SIGNS: ED Triage Vitals  Enc Vitals Group     BP 04/29/19 1816 (!) 146/95     Pulse Rate 04/29/19 1816 (!) 114     Resp 04/29/19 1816 16     Temp 04/29/19 1816 98.2 F (36.8 C)     Temp Source 04/29/19 1816 Oral     SpO2 04/29/19 1816 100 %     Weight 04/29/19 1759  158 lb 1.1 oz (71.7 kg)     Height --      Head Circumference --      Peak Flow --      Pain Score 04/29/19 1759 10     Pain Loc --      Pain Edu? --      Excl. in [place]? --      Constitutional: Alert and oriented. Well appearing and in no acute distress. Eyes: Conjunctivae are normal. PERRL. EOMI. Head: Atraumatic. ENT:      Ears: EAC and TM on left is unremarkable.  EAC on right edematous and erythematous.  TM is unremarkable.  Patient has an abrasion to the 6 o'clock position from Q-tip.  No TM perforation.  No retained foreign body.      Nose: No  congestion/rhinnorhea.      Mouth/Throat: Mucous membranes are moist.  Neck: No stridor.    Cardiovascular: Normal rate, regular rhythm. Normal S1 and S2.  Good peripheral circulation. Respiratory: Normal respiratory effort without tachypnea or retractions. Lungs CTAB. Good air entry to the bases with no decreased or absent breath sounds. Musculoskeletal: Full range of motion to all extremities. No gross deformities appreciated. Neurologic:  Normal speech and language. No gross focal neurologic deficits are appreciated.  Skin:  Skin is warm, dry and intact. No rash noted. Psychiatric: Mood and affect are normal. Speech and behavior are normal. Patient exhibits appropriate insight and judgement.   ____________________________________________   LABS (all labs ordered are listed, but only abnormal results are displayed)  Labs Reviewed - No data to display ____________________________________________  EKG   ____________________________________________  RADIOLOGY   No results found.  ____________________________________________    PROCEDURES  Procedure(s) performed:    Procedures    Medications  NEOMYCIN-POLYMYXIN-HYDROCORTISONE (CORTISPORIN) OTIC (EAR) solution 4 drop (has no administration in time range)     ____________________________________________   INITIAL IMPRESSION / ASSESSMENT AND PLAN / ED COURSE  Pertinent labs & imaging results that were available during my care of the patient were reviewed by me and considered in my medical decision making (see chart for details).  Review of the [place] CSRS was performed in accordance of the [place] prior to dispensing any controlled drugs.           Patient's diagnosis is consistent with otitis externa.  Patient presented to emergency department complaining of right ear pain/possible foreign body.  Visualization of the ear reveals no findings consistent with foreign body.  Patient does have evidence of otitis externa.   Patient will be started on antibiotic eardrops.  Follow-up with primary care as needed.. Patient is given ED precautions to return to the ED for any worsening or new symptoms.     ____________________________________________  FINAL CLINICAL IMPRESSION(S) / ED DIAGNOSES  Final diagnoses:  Other infective acute otitis externa of right ear      NEW MEDICATIONS STARTED DURING THIS VISIT:  ED Discharge Orders         Ordered    neomycin-polymyxin-hydrocortisone (CORTISPORIN) OTIC solution  4 times daily     04/29/19 1902              This chart was dictated using voice recognition software/Dragon. Despite best efforts to proofread, errors can occur which can change the meaning. Any change was purely unintentional.    Darletta Moll, PA-C 04/29/19 1914    Schuyler Amor, MD 04/29/19 [phone]

## 2019-04-29 NOTE — ED Notes (Signed)
When attempting to look in pt rt ear pt c/o pain when just touching outer ear. When attempting to insert the otoscope pt pushed the otoscope away without any visualization inside the ear. Pt c/o severe pain in right ear, stating it even hurts to have mask touching it.

## 2019-04-29 NOTE — ED Notes (Signed)
Pt refuses to wait for ear drops. Pt states she needs to leave now due to ride situation. Pt provided with prescription and discharge instructions.

## 2020-11-18 IMAGING — US TRANSVAGINAL ULTRASOUND OF PELVIS
1 series · 13 of 25 positions shown · non-contrast
Comparison: Prior CT from 08/01/2016

CLINICAL DATA: Initial evaluation for acute post coital vaginal
bleeding, right pelvic pain.



[Series 1: transvaginal ultrasound of pelvis · 0.24mm/px · 96 acquisitions, 13 frames shown]
[im 1/96]
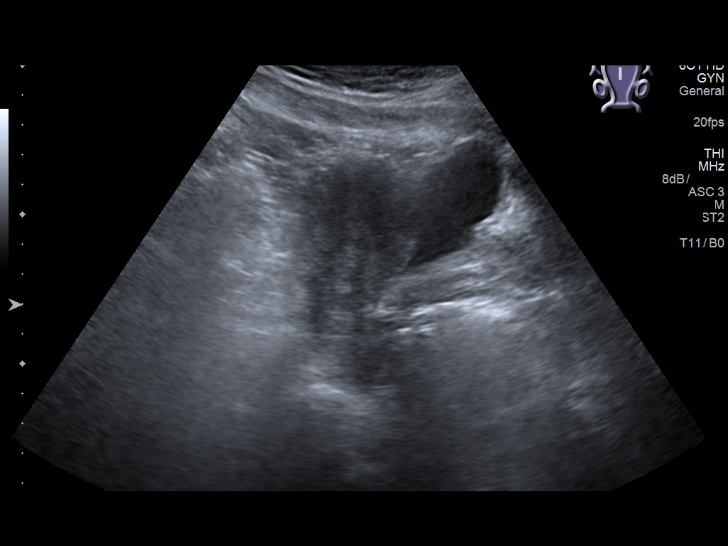
[im 8/96]
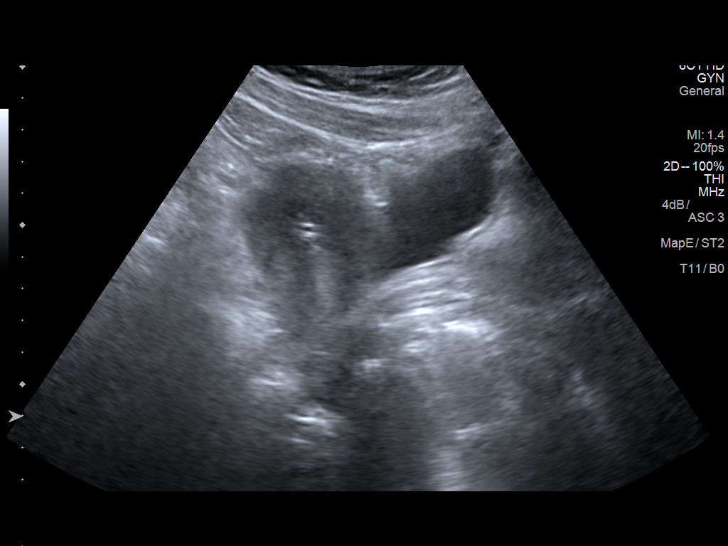
[im 16/96]
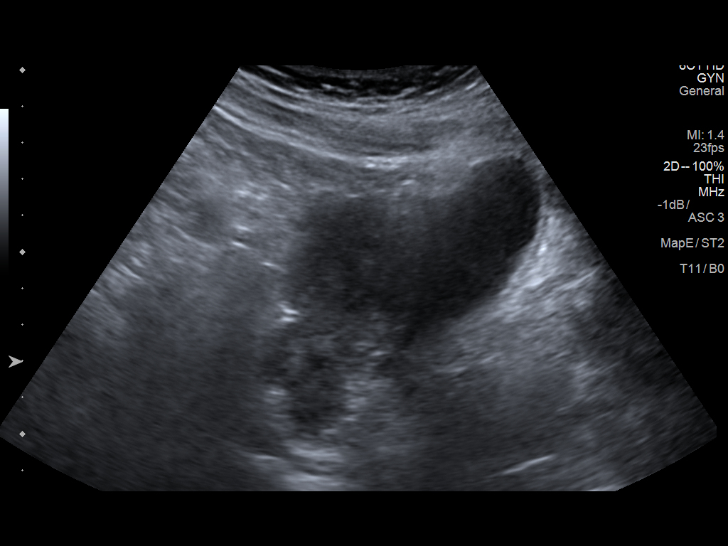
[im 24/96]
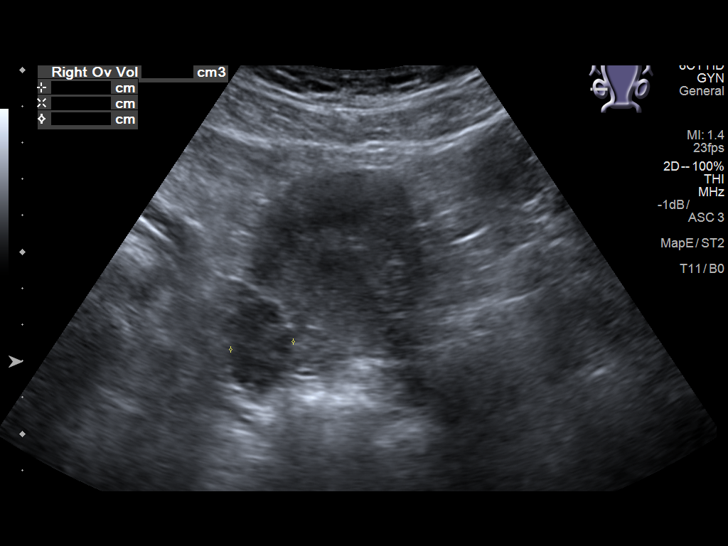
[im 32/96]
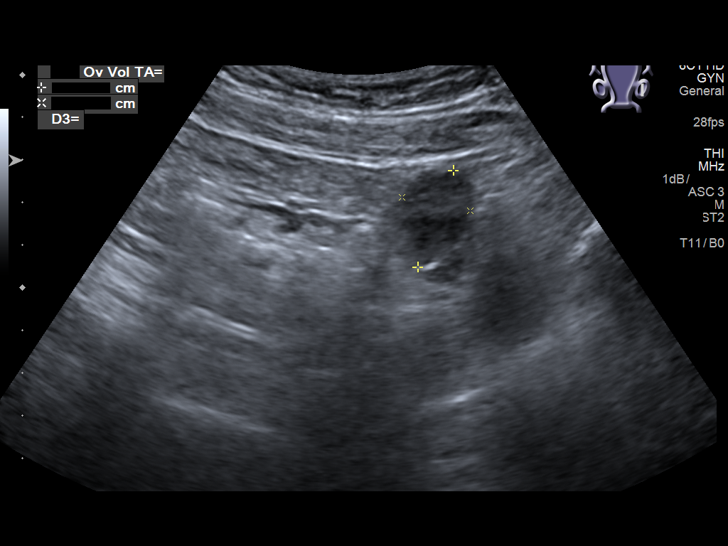
[im 40/96]
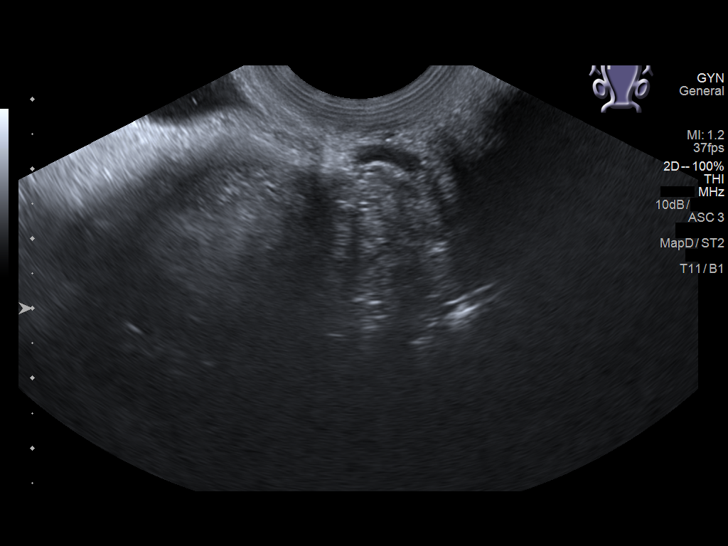
[im 48/96]
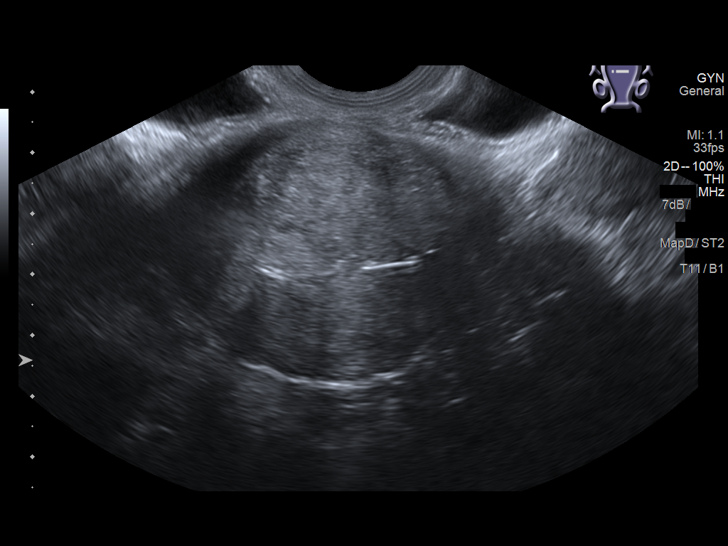
[im 56/96]
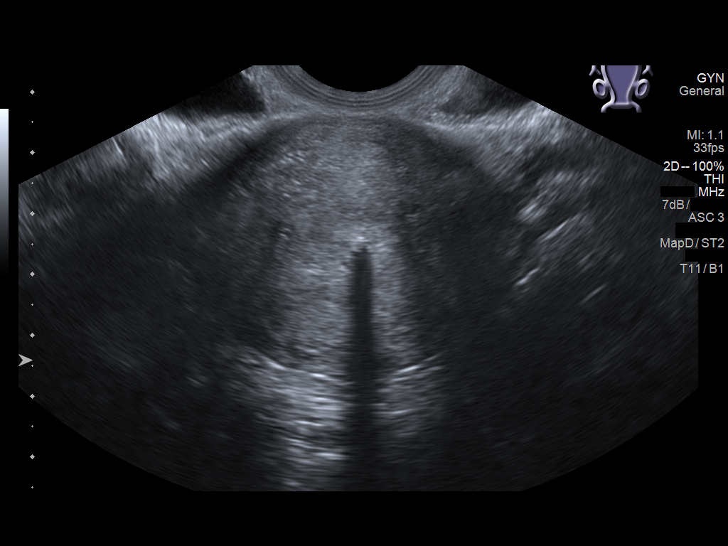
[im 64/96]
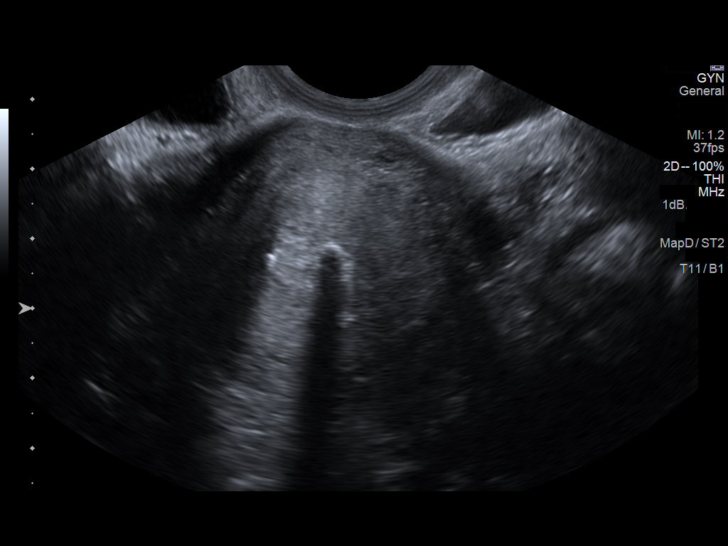
[im 72/96]
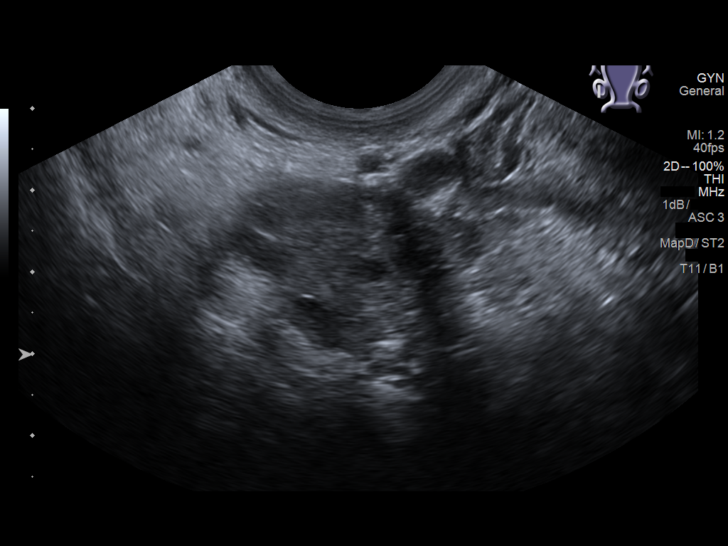
[im 80/96]
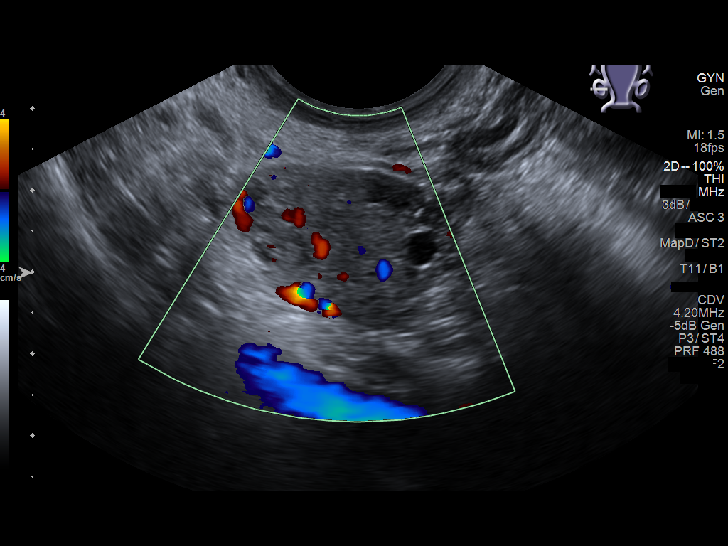
[im 88/96]
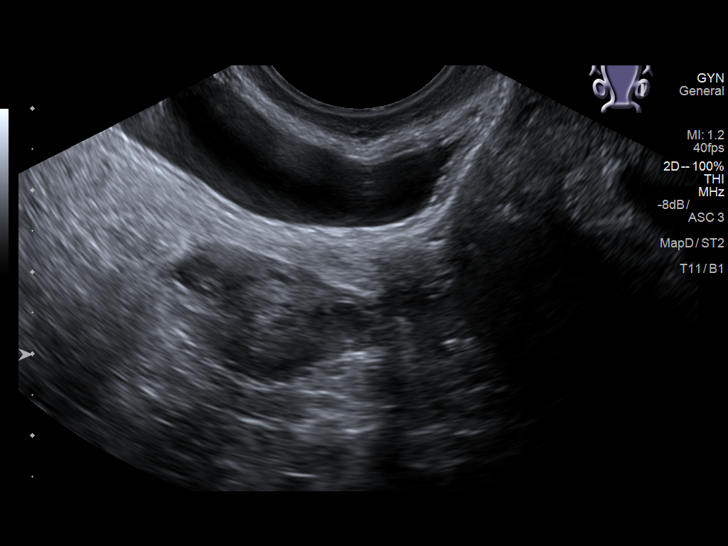
[im 96/96]
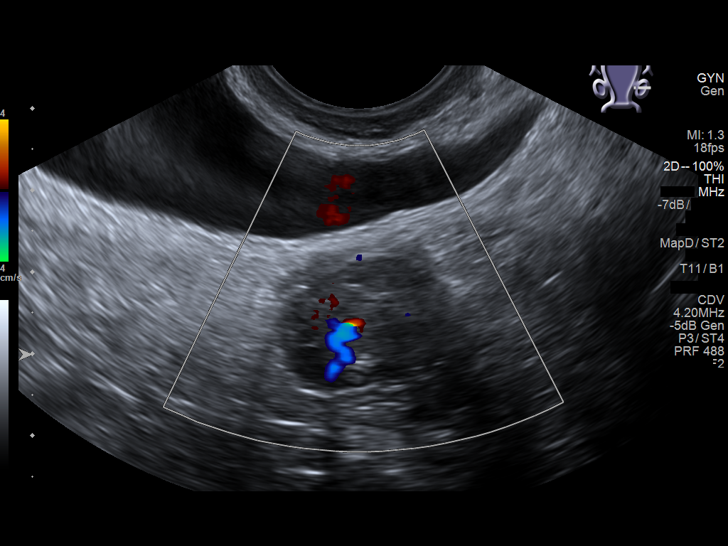

[13 of 25 positions shown; findings below may reference images not displayed]

FINDINGS: Uterus

Measurements: 8.2 x 4.3 x 5.6 cm = volume: 10.3 mL. No fibroids or
other mass visualized.

Endometrium

Thickness: 8.1 mm. No focal abnormality visualized. IUD in
appropriate position within the endometrial cavity.

Right ovary

Measurements: 3.2 x 2.1 x 2.5 cm = volume: 8.6 mL. Normal
appearance/no adnexal mass.

Left ovary

Measurements: 2.8 x 1.6 x 2.3 cm = volume: 5.3 mL. Normal
appearance/no adnexal mass.

Other findings

No abnormal free fluid.
IMPRESSION: 1. Normal pelvic ultrasound with no acute abnormality identified.
2. IUD in appropriate position within the endometrial cavity.
